# Patient Record
Sex: Male | Born: 1985 | Race: Black or African American | Hispanic: No | Marital: Single | Smoking: Former smoker
Health system: Southern US, Community
[De-identification: ages and names within clinical notes are randomized; demographics above are authoritative.]

## PROBLEM LIST (undated history)

## (undated) DIAGNOSIS — S52501A Unspecified fracture of the lower end of right radius, initial encounter for closed fracture: Secondary | ICD-10-CM

## (undated) HISTORY — PX: WISDOM TOOTH EXTRACTION: SHX21

---

## 2009-11-11 ENCOUNTER — Emergency Department (HOSPITAL_COMMUNITY): Admission: EM | Admit: 2009-11-11 | Discharge: 2009-11-12 | Payer: Self-pay | Admitting: Emergency Medicine

## 2011-01-02 ENCOUNTER — Emergency Department (HOSPITAL_COMMUNITY)
Admission: EM | Admit: 2011-01-02 | Discharge: 2011-01-02 | Disposition: A | Discharge status: Home / Self Care | Payer: Medicaid Other | Attending: Emergency Medicine | Admitting: Emergency Medicine

## 2011-01-02 DIAGNOSIS — S1093XA Contusion of unspecified part of neck, initial encounter: Secondary | ICD-10-CM | POA: Insufficient documentation

## 2011-01-02 DIAGNOSIS — T07XXXA Unspecified multiple injuries, initial encounter: Secondary | ICD-10-CM | POA: Insufficient documentation

## 2011-01-02 DIAGNOSIS — S61509A Unspecified open wound of unspecified wrist, initial encounter: Secondary | ICD-10-CM | POA: Insufficient documentation

## 2011-01-02 DIAGNOSIS — H1130 Conjunctival hemorrhage, unspecified eye: Secondary | ICD-10-CM | POA: Insufficient documentation

## 2011-01-02 DIAGNOSIS — S0003XA Contusion of scalp, initial encounter: Secondary | ICD-10-CM | POA: Insufficient documentation

## 2013-05-23 ENCOUNTER — Emergency Department (HOSPITAL_COMMUNITY)
Admission: EM | Admit: 2013-05-23 | Discharge: 2013-05-23 | Disposition: A | Discharge status: Home / Self Care | Payer: Medicaid Other | Attending: Emergency Medicine | Admitting: Emergency Medicine

## 2013-05-23 ENCOUNTER — Encounter (HOSPITAL_COMMUNITY): Payer: Self-pay | Admitting: Emergency Medicine

## 2013-05-23 DIAGNOSIS — F17200 Nicotine dependence, unspecified, uncomplicated: Secondary | ICD-10-CM | POA: Insufficient documentation

## 2013-05-23 DIAGNOSIS — H00019 Hordeolum externum unspecified eye, unspecified eyelid: Secondary | ICD-10-CM | POA: Insufficient documentation

## 2013-05-23 DIAGNOSIS — Y939 Activity, unspecified: Secondary | ICD-10-CM | POA: Insufficient documentation

## 2013-05-23 DIAGNOSIS — H00024 Hordeolum internum left upper eyelid: Secondary | ICD-10-CM

## 2013-05-23 DIAGNOSIS — Y929 Unspecified place or not applicable: Secondary | ICD-10-CM | POA: Insufficient documentation

## 2013-05-23 MED ORDER — POLYMYXIN B-TRIMETHOPRIM 10000-0.1 UNIT/ML-% OP SOLN: 1.0000 [drp] | OPHTHALMIC | Status: DC

## 2013-05-23 NOTE — ED Provider Notes (Signed)
   History    CSN: [id number] Arrival date & time 05/23/13  1336  First MD Initiated Contact with Patient 05/23/13 1350     Chief Complaint  Patient presents with  . Eye Problem    left eye  . Insect Bite    left eye   (Consider location/radiation/quality/duration/timing/severity/associated sxs/prior Treatment) HPI Comments: Patient is a 27 year old male who presents with a 2 day history of left eyelid swelling. Symptoms started gradually and progressively worsened since the onset. Patient thinks he was bit by a bug. Patient reports associated pain of the upper left eyelid with palpation. The pain is dull and moderate without radiation. Patient denies visual changes or injury to the eye. No alleviating factors.   Patient is a 27 y.o. male presenting with eye problem.  Eye Problem  History reviewed. No pertinent past medical history. History reviewed. No pertinent past surgical history. No family history on file. History  Substance Use Topics  . Smoking status: Current Every Day Smoker  . Smokeless tobacco: Not on file  . Alcohol Use: Yes    Review of Systems  Eyes: Positive for pain.  All other systems reviewed and are negative.    Allergies  Review of patient's allergies indicates no known allergies.  Home Medications  No current outpatient prescriptions on file. BP 128/76  Pulse 74  Temp(Src) 98.2 F (36.8 C) (Oral)  Resp 16  Ht 6\' 1"  (1.854 m)  Wt 180 lb (81.647 kg)  BMI 23.75 kg/m2  SpO2 97% Physical Exam  Nursing note and vitals reviewed. Constitutional: He is oriented to person, place, and time. He appears well-developed and well-nourished. No distress.  HENT:  Head: Normocephalic and atraumatic.  Eyes: Conjunctivae and EOM are normal. Pupils are equal, round, and reactive to light. No scleral icterus.  Left upper eyelid edema with tenderness to palpation over medial aspect.   Neck: Normal range of motion.  Cardiovascular: Normal rate and regular rhythm.   Exam reveals no gallop and no friction rub.   No murmur heard. Pulmonary/Chest: Effort normal and breath sounds normal. He has no wheezes. He has no rales. He exhibits no tenderness.  Abdominal: Soft. He exhibits no distension. There is no tenderness. There is no rebound.  Musculoskeletal: Normal range of motion.  Neurological: He is alert and oriented to person, place, and time. Coordination normal.  Speech is goal-oriented. Moves limbs without ataxia.   Skin: Skin is warm and dry.  Psychiatric: He has a normal mood and affect. His behavior is normal.    ED Course  Procedures (including critical care time) Labs Reviewed - No data to display No results found. 1. Hordeolum internum of left upper eyelid     MDM  2:24 PM Patient likely has hordeolum. I will treat the patient with polytrim drops. Vitals stable and patient afebrile. Patient will be discharged without further evaluation. Patient instructed to return with worsening or concerning symptoms.   Emilia Beck, PA-C 05/23/13 1429

## 2013-05-23 NOTE — ED Notes (Signed)
Pt reports he noticed his left eye swollen. Pt reports possibly bit by an insect. Pt reports sleep over a friends house who window has a hole in it and is known to have wasp around the area.

## 2013-05-23 NOTE — ED Notes (Signed)
Pt discharged.Vital signs stable and GCS 15 

## 2013-05-24 NOTE — ED Provider Notes (Signed)
Medical screening examination/treatment/procedure(s) were performed by non-physician practitioner and as supervising physician I was immediately available for consultation/collaboration.    Vida Roller, MD 05/24/13 [phone]

## 2013-09-18 DIAGNOSIS — S52501A Unspecified fracture of the lower end of right radius, initial encounter for closed fracture: Secondary | ICD-10-CM

## 2013-09-18 HISTORY — DX: Unspecified fracture of the lower end of right radius, initial encounter for closed fracture: S52.501A

## 2013-09-19 ENCOUNTER — Emergency Department (HOSPITAL_COMMUNITY): Payer: Medicaid Other

## 2013-09-19 ENCOUNTER — Encounter (HOSPITAL_COMMUNITY): Payer: Self-pay | Admitting: Emergency Medicine

## 2013-09-19 ENCOUNTER — Emergency Department (HOSPITAL_COMMUNITY)
Admission: EM | Admit: 2013-09-19 | Discharge: 2013-09-19 | Disposition: A | Discharge status: Home / Self Care | Payer: Medicaid Other | Attending: Emergency Medicine | Admitting: Emergency Medicine

## 2013-09-19 DIAGNOSIS — F17200 Nicotine dependence, unspecified, uncomplicated: Secondary | ICD-10-CM | POA: Insufficient documentation

## 2013-09-19 DIAGNOSIS — Y929 Unspecified place or not applicable: Secondary | ICD-10-CM | POA: Insufficient documentation

## 2013-09-19 DIAGNOSIS — Y9389 Activity, other specified: Secondary | ICD-10-CM | POA: Insufficient documentation

## 2013-09-19 DIAGNOSIS — S52599A Other fractures of lower end of unspecified radius, initial encounter for closed fracture: Secondary | ICD-10-CM | POA: Insufficient documentation

## 2013-09-19 DIAGNOSIS — S52501A Unspecified fracture of the lower end of right radius, initial encounter for closed fracture: Secondary | ICD-10-CM

## 2013-09-19 DIAGNOSIS — R296 Repeated falls: Secondary | ICD-10-CM | POA: Insufficient documentation

## 2013-09-19 MED ORDER — OXYCODONE-ACETAMINOPHEN 5-325 MG PO TABS
2.0000 | ORAL_TABLET | Freq: Once | ORAL | Status: AC
  Administered 2013-09-19: 2 via ORAL
  Filled 2013-09-19: qty 2

## 2013-09-19 MED ORDER — NAPROXEN 500 MG PO TABS: 500.0000 mg | ORAL_TABLET | Freq: Two times a day (BID) | ORAL | Status: DC

## 2013-09-19 MED ORDER — OXYCODONE-ACETAMINOPHEN 5-325 MG PO TABS: 1.0000 | ORAL_TABLET | Freq: Four times a day (QID) | ORAL | Status: DC | PRN

## 2013-09-19 NOTE — ED Provider Notes (Signed)
CSN: [id number]     Arrival date & time 09/19/13  1815 History   First MD Initiated Contact with Patient 09/19/13 1823     Chief Complaint  Patient presents with  . Wrist Injury   (Consider location/radiation/quality/duration/timing/severity/associated sxs/prior Treatment) HPI Comments: Patient presents with right wrist injury that he sustained late last night while intoxicated. Patient reports falling. He is unable to remember the details of the event. He is unsure as to what position he fell. Patient states that he did not feel any pain last night but upon waking today he had severe throbbing in his left wrist and forearm. Patient is unable to move his wrist due to pain. He denies numbness or tingling in his hand or fingers. He is able to wiggle his fingers. No treatments prior to arrival. Patient denies other injury from the fall including hitting his head, neck pain, nausea or vomiting, blurry vision. Course is constant.  Patient is a 27 y.o. male presenting with wrist injury. The history is provided by the patient.  Wrist Injury Associated symptoms: no back pain and no neck pain     History reviewed. No pertinent past medical history. History reviewed. No pertinent past surgical history. History reviewed. No pertinent family history. History  Substance Use Topics  . Smoking status: Current Every Day Smoker  . Smokeless tobacco: Not on file  . Alcohol Use: Yes    Review of Systems  Constitutional: Negative for activity change.  Musculoskeletal: Positive for arthralgias. Negative for back pain, gait problem, joint swelling and neck pain.  Skin: Negative for wound.  Neurological: Negative for weakness and numbness.    Allergies  Review of patient's allergies indicates no known allergies.  Home Medications  No current outpatient prescriptions on file. BP 143/71  Pulse 70  Temp(Src) 100 F (37.8 C) (Oral)  Resp 16  Wt 170 lb (77.111 kg)  SpO2 98% Physical Exam  Nursing  note and vitals reviewed. Constitutional: He appears well-developed and well-nourished.  HENT:  Head: Normocephalic and atraumatic.  Eyes: Conjunctivae are normal.  Neck: Normal range of motion. Neck supple.  Cardiovascular: Normal pulses.   Pulses:      Radial pulses are 2+ on the right side, and 2+ on the left side.  Musculoskeletal: He exhibits tenderness. He exhibits no edema.       Right shoulder: Normal.       Right elbow: Normal.      Right wrist: He exhibits decreased range of motion, tenderness, bony tenderness, swelling and deformity (mild).       Cervical back: Normal.       Right forearm: Normal.       Right hand: Normal. Normal sensation noted.  Neurological: He is alert. No sensory deficit.  Motor, sensation, and vascular distal to the injury is fully intact.   Skin: Skin is warm and dry.  Psychiatric: He has a normal mood and affect.    ED Course  Procedures (including critical care time) Labs Review Labs Reviewed - No data to display Imaging Review Dg Wrist Complete Right  09/19/2013   CLINICAL DATA:  Fall.  Wrist injury.  EXAM: RIGHT WRIST - COMPLETE 3+ VIEW  COMPARISON:  None.  FINDINGS: Mildly comminuted distal radial fracture with transverse metaphyseal component and extension into the radiocarpal joint. Transverse ulnar styloid fracture. Radial fracture is more impacted posteriorly. The carpus is not particularly posteriorly displaced.  Old boxer's fracture of the 5th metacarpal noted. There is a small bony density along  the ulnar base of the 5th metacarpal suspicious for age indeterminate fracture.  IMPRESSION: 1. Comminuted distal radial fracture with intra-articular extension, and more impaction dorsally. The carpus does not appear significantly posteriorly displaced currently. 2. Transverse ulnar styloid fracture. 3. Age indeterminate fracture along the ulnar side of the base of the 5th metacarpal. Old boxer's fracture of the 5th metacarpal also noted.    Electronically Signed   By: Herbie Baltimore M.D.   On: 09/19/2013 19:25    EKG Interpretation   None      7:57 PM Patient seen and examined. Informed of x-ray results. Medications ordered. D/w Dr. Anitra Lauth. Will call ortho for reccs.   Vital signs reviewed and are as follows: Filed Vitals:   09/19/13 1850  BP: 143/71  Pulse: 70  Temp: 100 F (37.8 C)  Resp: 16   8:10 PM Spoke with Dr. Mina Marble who has reviewed films. Pt instructed to call office tomorrow for f/u on Tuesday.   Will d/c with pain medications.   Patient counseled on use of narcotic pain medications. Counseled not to combine these medications with others containing tylenol. Urged not to drink alcohol, drive, or perform any other activities that requires focus while taking these medications. The patient verbalizes understanding and agrees with the plan.  Splint and sling by ortho tech.    MDM   1. Distal radius fracture, right, closed, initial encounter    Fracture per above. The extremity is neurovascularly intact. Orthopedic hand followup obtained. Patient provided definitive fracture management with sling and splint. No other injury from fall suspected. Patient appears well.    Renne Crigler, PA-C 09/19/13 2013

## 2013-09-19 NOTE — ED Notes (Signed)
Pt reports that last night he was drinking and fell on his right wrist. States that the swelling has gotten worse and throbbing in nature. Deformity noted to the wrist. Pulse palpated.

## 2013-09-19 NOTE — ED Notes (Signed)
Ortho at bedside.

## 2013-09-19 NOTE — ED Notes (Signed)
PT comfortable with d/c and f/u instructions. Prescriptions x2 

## 2013-09-19 NOTE — Progress Notes (Signed)
Orthopedic Tech Progress Note Patient Details:  Jeremy Burke 1986/05/28 [id number]  Ortho Devices Type of Ortho Device: Ace wrap;Arm sling;Sugartong splint Ortho Device/Splint Location: RUE Ortho Device/Splint Interventions: Ordered;Application   Jennye Moccasin 09/19/2013, 8:26 PM

## 2013-09-20 NOTE — ED Provider Notes (Signed)
Medical screening examination/treatment/procedure(s) were performed by non-physician practitioner and as supervising physician I was immediately available for consultation/collaboration.  EKG Interpretation   None         Ima Hafner, MD 09/20/13 1354 

## 2013-09-21 ENCOUNTER — Encounter (HOSPITAL_BASED_OUTPATIENT_CLINIC_OR_DEPARTMENT_OTHER): Payer: Self-pay | Admitting: *Deleted

## 2013-09-21 ENCOUNTER — Other Ambulatory Visit: Payer: Self-pay | Admitting: Orthopedic Surgery

## 2013-09-22 ENCOUNTER — Encounter (HOSPITAL_BASED_OUTPATIENT_CLINIC_OR_DEPARTMENT_OTHER): Payer: Medicaid Other | Admitting: Anesthesiology

## 2013-09-22 ENCOUNTER — Encounter (HOSPITAL_BASED_OUTPATIENT_CLINIC_OR_DEPARTMENT_OTHER)
Admission: RE | Disposition: A | Discharge status: Home / Self Care | Payer: Self-pay | Source: Ambulatory Visit | Attending: Orthopedic Surgery

## 2013-09-22 ENCOUNTER — Ambulatory Visit (HOSPITAL_BASED_OUTPATIENT_CLINIC_OR_DEPARTMENT_OTHER): Payer: Medicaid Other | Admitting: Anesthesiology

## 2013-09-22 ENCOUNTER — Encounter (HOSPITAL_BASED_OUTPATIENT_CLINIC_OR_DEPARTMENT_OTHER): Payer: Self-pay

## 2013-09-22 ENCOUNTER — Ambulatory Visit (HOSPITAL_BASED_OUTPATIENT_CLINIC_OR_DEPARTMENT_OTHER)
Admission: RE | Admit: 2013-09-22 | Discharge: 2013-09-22 | Disposition: A | Discharge status: Home / Self Care | Payer: Medicaid Other | Source: Ambulatory Visit | Attending: Orthopedic Surgery | Admitting: Orthopedic Surgery

## 2013-09-22 DIAGNOSIS — F17200 Nicotine dependence, unspecified, uncomplicated: Secondary | ICD-10-CM | POA: Insufficient documentation

## 2013-09-22 DIAGNOSIS — S52531A Colles' fracture of right radius, initial encounter for closed fracture: Secondary | ICD-10-CM

## 2013-09-22 DIAGNOSIS — W19XXXA Unspecified fall, initial encounter: Secondary | ICD-10-CM | POA: Insufficient documentation

## 2013-09-22 DIAGNOSIS — S52599A Other fractures of lower end of unspecified radius, initial encounter for closed fracture: Secondary | ICD-10-CM | POA: Insufficient documentation

## 2013-09-22 HISTORY — PX: OPEN REDUCTION INTERNAL FIXATION (ORIF) DISTAL RADIAL FRACTURE: SHX5989

## 2013-09-22 HISTORY — DX: Unspecified fracture of the lower end of right radius, initial encounter for closed fracture: S52.501A

## 2013-09-22 LAB — POCT HEMOGLOBIN-HEMACUE: Hemoglobin: 13.3 g/dL (ref 13.0–17.0)

## 2013-09-22 SURGERY — OPEN REDUCTION INTERNAL FIXATION (ORIF) DISTAL RADIUS FRACTURE
Anesthesia: Regional | Site: Wrist | Laterality: Right | Wound class: Clean

## 2013-09-22 MED ORDER — CEFAZOLIN SODIUM-DEXTROSE 2-3 GM-% IV SOLR
2.0000 g | INTRAVENOUS | Status: AC
  Administered 2013-09-22: 2 g via INTRAVENOUS

## 2013-09-22 MED ORDER — BUPIVACAINE-EPINEPHRINE PF 0.5-1:200000 % IJ SOLN
INTRAMUSCULAR | Status: DC | PRN
  Administered 2013-09-22: 30 mL

## 2013-09-22 MED ORDER — LIDOCAINE HCL (CARDIAC) 20 MG/ML IV SOLN
INTRAVENOUS | Status: DC | PRN
  Administered 2013-09-22: 50 mg via INTRAVENOUS

## 2013-09-22 MED ORDER — OXYCODONE HCL 5 MG/5ML PO SOLN: 5.0000 mg | Freq: Once | ORAL | Status: DC | PRN

## 2013-09-22 MED ORDER — MIDAZOLAM HCL 2 MG/2ML IJ SOLN
INTRAMUSCULAR | Status: AC
  Filled 2013-09-22: qty 2

## 2013-09-22 MED ORDER — SUCCINYLCHOLINE CHLORIDE 20 MG/ML IJ SOLN
INTRAMUSCULAR | Status: DC | PRN
  Administered 2013-09-22: 10 mg via INTRAVENOUS

## 2013-09-22 MED ORDER — LACTATED RINGERS IV SOLN
INTRAVENOUS | Status: DC
  Administered 2013-09-22: 08:00:00 via INTRAVENOUS

## 2013-09-22 MED ORDER — HYDROMORPHONE HCL PF 1 MG/ML IJ SOLN: 0.2500 mg | INTRAMUSCULAR | Status: DC | PRN

## 2013-09-22 MED ORDER — FENTANYL CITRATE 0.05 MG/ML IJ SOLN
INTRAMUSCULAR | Status: AC
  Filled 2013-09-22: qty 2

## 2013-09-22 MED ORDER — MIDAZOLAM HCL 2 MG/2ML IJ SOLN
1.0000 mg | INTRAMUSCULAR | Status: DC | PRN
Start: 2013-09-22 — End: 2013-09-22
  Administered 2013-09-22: 2 mg via INTRAVENOUS

## 2013-09-22 MED ORDER — DEXAMETHASONE SODIUM PHOSPHATE 4 MG/ML IJ SOLN
INTRAMUSCULAR | Status: DC | PRN
  Administered 2013-09-22: 10 mg via INTRAVENOUS

## 2013-09-22 MED ORDER — ONDANSETRON HCL 4 MG/2ML IJ SOLN
INTRAMUSCULAR | Status: DC | PRN
  Administered 2013-09-22: 4 mg via INTRAVENOUS

## 2013-09-22 MED ORDER — FENTANYL CITRATE 0.05 MG/ML IJ SOLN
50.0000 ug | INTRAMUSCULAR | Status: DC | PRN
  Administered 2013-09-22: 100 ug via INTRAVENOUS

## 2013-09-22 MED ORDER — MIDAZOLAM HCL 2 MG/ML PO SYRP: 12.0000 mg | ORAL_SOLUTION | Freq: Once | ORAL | Status: DC | PRN

## 2013-09-22 MED ORDER — OXYCODONE-ACETAMINOPHEN 5-325 MG PO TABS: 1.0000 | ORAL_TABLET | ORAL | Status: DC | PRN

## 2013-09-22 MED ORDER — OXYCODONE HCL 5 MG PO TABS: 5.0000 mg | ORAL_TABLET | Freq: Once | ORAL | Status: DC | PRN

## 2013-09-22 MED ORDER — CEFAZOLIN SODIUM 1-5 GM-% IV SOLN
INTRAVENOUS | Status: AC
  Filled 2013-09-22: qty 100

## 2013-09-22 MED ORDER — CHLORHEXIDINE GLUCONATE 4 % EX LIQD: 60.0000 mL | Freq: Once | CUTANEOUS | Status: DC

## 2013-09-22 MED ORDER — BUPIVACAINE HCL (PF) 0.25 % IJ SOLN
INTRAMUSCULAR | Status: AC
  Filled 2013-09-22: qty 30

## 2013-09-22 MED ORDER — PROPOFOL 10 MG/ML IV EMUL
INTRAVENOUS | Status: AC
  Filled 2013-09-22: qty 50

## 2013-09-22 MED ORDER — PROPOFOL 10 MG/ML IV BOLUS
INTRAVENOUS | Status: DC | PRN
  Administered 2013-09-22: 50 mg via INTRAVENOUS
  Administered 2013-09-22: 200 mg via INTRAVENOUS

## 2013-09-22 MED ORDER — GLYCOPYRROLATE 0.2 MG/ML IJ SOLN
INTRAMUSCULAR | Status: DC | PRN
  Administered 2013-09-22: 0.2 mg via INTRAVENOUS

## 2013-09-22 MED ORDER — BUPIVACAINE HCL (PF) 0.5 % IJ SOLN
INTRAMUSCULAR | Status: DC | PRN
  Administered 2013-09-22: 8 mL via PERINEURAL

## 2013-09-22 SURGICAL SUPPLY — 70 items
APL SKNCLS STERI-STRIP NONHPOA (GAUZE/BANDAGES/DRESSINGS) ×1
BAG DECANTER FOR FLEXI CONT (MISCELLANEOUS) IMPLANT
BANDAGE ELASTIC 3 VELCRO ST LF (GAUZE/BANDAGES/DRESSINGS) IMPLANT
BANDAGE ELASTIC 4 VELCRO ST LF (GAUZE/BANDAGES/DRESSINGS) ×2 IMPLANT
BANDAGE GAUZE ELAST BULKY 4 IN (GAUZE/BANDAGES/DRESSINGS) ×2 IMPLANT
BENZOIN TINCTURE PRP APPL 2/3 (GAUZE/BANDAGES/DRESSINGS) ×2 IMPLANT
BIT DRILL 2 FAST STEP (BIT) ×2 IMPLANT
BIT DRILL 2.5X4 QC (BIT) ×2 IMPLANT
BLADE MINI RND TIP GREEN BEAV (BLADE) IMPLANT
BLADE SURG 15 STRL LF DISP TIS (BLADE) ×2 IMPLANT
BLADE SURG 15 STRL SS (BLADE) ×4
BNDG CMPR 9X4 STRL LF SNTH (GAUZE/BANDAGES/DRESSINGS) ×1
BNDG ESMARK 4X9 LF (GAUZE/BANDAGES/DRESSINGS) ×2 IMPLANT
CANISTER SUCT 1200ML W/VALVE (MISCELLANEOUS) IMPLANT
CORDS BIPOLAR (ELECTRODE) ×2 IMPLANT
COVER TABLE BACK 60X90 (DRAPES) ×2 IMPLANT
CUFF TOURNIQUET SINGLE 18IN (TOURNIQUET CUFF) ×2 IMPLANT
DECANTER SPIKE VIAL GLASS SM (MISCELLANEOUS) IMPLANT
DRAPE EXTREMITY T 121X128X90 (DRAPE) ×2 IMPLANT
DRAPE OEC MINIVIEW 54X84 (DRAPES) ×2 IMPLANT
DRAPE SURG 17X23 STRL (DRAPES) ×2 IMPLANT
DURAPREP 26ML APPLICATOR (WOUND CARE) ×2 IMPLANT
ELECT REM PT RETURN 9FT ADLT (ELECTROSURGICAL)
ELECTRODE REM PT RTRN 9FT ADLT (ELECTROSURGICAL) IMPLANT
GAUZE SPONGE 4X4 16PLY XRAY LF (GAUZE/BANDAGES/DRESSINGS) IMPLANT
GAUZE XEROFORM 1X8 LF (GAUZE/BANDAGES/DRESSINGS) IMPLANT
GLOVE BIO SURGEON STRL SZ7 (GLOVE) ×2 IMPLANT
GLOVE BIO SURGEON STRL SZ8 (GLOVE) ×2 IMPLANT
GLOVE BIOGEL M STRL SZ7.5 (GLOVE) ×2 IMPLANT
GOWN BRE IMP PREV XXLGXLNG (GOWN DISPOSABLE) ×2 IMPLANT
GOWN PREVENTION PLUS XLARGE (GOWN DISPOSABLE) ×2 IMPLANT
NEEDLE HYPO 25X1 1.5 SAFETY (NEEDLE) ×2 IMPLANT
NS IRRIG 1000ML POUR BTL (IV SOLUTION) ×2 IMPLANT
PACK BASIN DAY SURGERY FS (CUSTOM PROCEDURE TRAY) ×2 IMPLANT
PAD CAST 3X4 CTTN HI CHSV (CAST SUPPLIES) ×1 IMPLANT
PAD CAST 4YDX4 CTTN HI CHSV (CAST SUPPLIES) IMPLANT
PADDING CAST ABS 4INX4YD NS (CAST SUPPLIES) ×1
PADDING CAST ABS COTTON 4X4 ST (CAST SUPPLIES) ×1 IMPLANT
PADDING CAST COTTON 3X4 STRL (CAST SUPPLIES) ×2
PADDING CAST COTTON 4X4 STRL (CAST SUPPLIES)
PEG SUBCHONDRAL SMOOTH 2.0X20 (Peg) ×2 IMPLANT
PEG SUBCHONDRAL SMOOTH 2.0X22 (Peg) ×6 IMPLANT
PEG SUBCHONDRAL SMOOTH 2.0X24 (Peg) ×6 IMPLANT
PENCIL BUTTON HOLSTER BLD 10FT (ELECTRODE) IMPLANT
PLATE STAN 24.4X59.5 RT (Plate) ×2 IMPLANT
SCREW BN 12X3.5XNS CORT TI (Screw) ×2 IMPLANT
SCREW CORT 3.5X12 (Screw) ×4 IMPLANT
SCREW CORT 3.5X14 LNG (Screw) ×2 IMPLANT
SHEET MEDIUM DRAPE 40X70 STRL (DRAPES) ×2 IMPLANT
SPLINT PLASTER CAST XFAST 3X15 (CAST SUPPLIES) ×8 IMPLANT
SPLINT PLASTER CAST XFAST 4X15 (CAST SUPPLIES) ×5 IMPLANT
SPLINT PLASTER XTRA FAST SET 4 (CAST SUPPLIES) ×5
SPLINT PLASTER XTRA FASTSET 3X (CAST SUPPLIES) ×8
SPONGE GAUZE 4X4 12PLY (GAUZE/BANDAGES/DRESSINGS) ×2 IMPLANT
STOCKINETTE 4X48 STRL (DRAPES) ×2 IMPLANT
STRIP CLOSURE SKIN 1/2X4 (GAUZE/BANDAGES/DRESSINGS) ×2 IMPLANT
SUCTION FRAZIER TIP 10 FR DISP (SUCTIONS) ×2 IMPLANT
SUT ETHILON 4 0 PS 2 18 (SUTURE) IMPLANT
SUT MERSILENE 4 0 P 3 (SUTURE) IMPLANT
SUT PROLENE 3 0 PS 2 (SUTURE) IMPLANT
SUT SILK 2 0 FS (SUTURE) IMPLANT
SUT VIC AB 0 SH 27 (SUTURE) ×2 IMPLANT
SUT VIC AB 3-0 FS2 27 (SUTURE) IMPLANT
SUT VIC AB 4-0 RB1 18 (SUTURE) ×2 IMPLANT
SUT VICRYL RAPIDE 4/0 PS 2 (SUTURE) ×6 IMPLANT
SYR BULB 3OZ (MISCELLANEOUS) ×2 IMPLANT
SYRINGE 10CC LL (SYRINGE) ×2 IMPLANT
TOWEL OR 17X24 6PK STRL BLUE (TOWEL DISPOSABLE) ×2 IMPLANT
TUBE CONNECTING 20X1/4 (TUBING) ×2 IMPLANT
UNDERPAD 30X30 INCONTINENT (UNDERPADS AND DIAPERS) ×6 IMPLANT

## 2013-09-22 NOTE — Anesthesia Procedure Notes (Addendum)
Anesthesia Regional Block:  Supraclavicular block  Pre-Anesthetic Checklist: ,, timeout performed, Correct Patient, Correct Site, Correct Laterality, Correct Procedure, Correct Position, site marked, Risks and benefits discussed, pre-op evaluation, post-op pain management  Laterality: Right  Prep: Maximum Sterile Barrier Precautions used and chloraprep       Needles:  Injection technique: Single-shot  Needle Type: Echogenic Stimulator Needle     Needle Length: 5cm 5 cm Needle Gauge: 22 and 22 G    Additional Needles:  Procedures: ultrasound guided (picture in chart) Supraclavicular block Narrative:  Start time: 09/22/2013 8:00 AM End time: 09/22/2013 8:10 AM Injection made incrementally with aspirations every 5 mL. Anesthesiologist: Fitzgerald,MD  Additional Notes: 2% Lidocaine skin wheel. Intercostobrachial block with 8cc of 0.5% Bupivicaine plain.  Supraclavicular block Procedure Name: LMA Insertion Date/Time: 09/22/2013 8:39 AM Performed by: Gar Gibbon Pre-anesthesia Checklist: Patient identified, Emergency Drugs available, Suction available and Patient being monitored Patient Re-evaluated:Patient Re-evaluated prior to inductionOxygen Delivery Method: Circle System Utilized Preoxygenation: Pre-oxygenation with 100% oxygen Intubation Type: IV induction Ventilation: Mask ventilation without difficulty LMA: LMA inserted LMA Size: 4.0 Number of attempts: 1 Airway Equipment and Method: bite block Placement Confirmation: positive ETCO2 Tube secured with: Tape Dental Injury: Teeth and Oropharynx as per pre-operative assessment

## 2013-09-22 NOTE — Op Note (Signed)
See note [id number]

## 2013-09-22 NOTE — Transfer of Care (Signed)
Immediate Anesthesia Transfer of Care Note  Patient: Jeremy Burke  Procedure(s) Performed: Procedure(s): RIGHT OPEN REDUCTION INTERNAL FIXATION (ORIF) DISTAL RADIAL FRACTURE (Right)  Patient Location: PACU  Anesthesia Type:GA combined with regional for post-op pain  Level of Consciousness: awake, sedated and patient cooperative  Airway & Oxygen Therapy: Patient Spontanous Breathing and Patient connected to face mask oxygen  Post-op Assessment: Report given to PACU RN and Post -op Vital signs reviewed and stable  Post vital signs: Reviewed and stable  Complications: No apparent anesthesia complications

## 2013-09-22 NOTE — H&P (Signed)
Jeremy Burke is an 27 y.o. male.   Chief Complaint: right wrist pain HPI: as above s/p fall onto right wrist with displaced distal radius fracture  Past Medical History  Diagnosis Date  . Distal radius fracture, right 09/18/2013    Past Surgical History  Procedure Laterality Date  . Wisdom tooth extraction      History reviewed. No pertinent family history. Social History:  reports that he has been smoking Cigarettes.  He has been smoking about 0.00 packs per day for the past 5 years. He has never used smokeless tobacco. He reports that he drinks alcohol. He reports that he does not use illicit drugs.  Allergies: No Known Allergies  Medications Prior to Admission  Medication Sig Dispense Refill  . naproxen (NAPROSYN) 500 MG tablet Take 1 tablet (500 mg total) by mouth 2 (two) times daily.  20 tablet  0  . oxyCODONE-acetaminophen (PERCOCET/ROXICET) 5-325 MG per tablet Take 1-2 tablets by mouth every 6 (six) hours as needed for severe pain.  15 tablet  0    Results for orders placed during the hospital encounter of 09/22/13 (from the past 48 hour(s))  POCT HEMOGLOBIN-HEMACUE     Status: None   Collection Time    09/22/13  7:56 AM      Result Value Range   Hemoglobin 13.3  13.0 - 17.0 g/dL   No results found.  Review of Systems  All other systems reviewed and are negative.    Blood pressure 141/77, pulse 71, temperature 99.2 F (37.3 C), temperature source Oral, resp. rate 20, height 6\' 1"  (1.854 m), weight 74.844 kg (165 lb), SpO2 98.00%. Physical Exam  Constitutional: He is oriented to person, place, and time. He appears well-developed and well-nourished.  HENT:  Head: Normocephalic and atraumatic.  Cardiovascular: Normal rate.   Respiratory: Effort normal.  Musculoskeletal:       Right wrist: He exhibits bony tenderness, swelling and deformity.  Displaced right distal radius fracture  Neurological: He is alert and oriented to person, place, and time.  Skin:  Skin is warm.  Psychiatric: He has a normal mood and affect. His behavior is normal. Judgment and thought content normal.     Assessment/Plan As above  Plan ORIF  Vallorie Niccoli A 09/22/2013, 8:16 AM

## 2013-09-22 NOTE — Progress Notes (Signed)
Assisted Dr. Fitzgerald with right, ultrasound guided, supraclavicular block. Side rails up, monitors on throughout procedure. See vital signs in flow sheet. Tolerated Procedure well. 

## 2013-09-22 NOTE — Anesthesia Preprocedure Evaluation (Signed)
Anesthesia Evaluation  Patient identified by MRN, date of birth, ID band Patient awake    Reviewed: Allergy & Precautions, H&P , NPO status , Patient's Chart, lab work & pertinent test results  Airway Mallampati: I TM Distance: >3 FB Neck ROM: Full    Dental no notable dental hx. (+) Teeth Intact and Dental Advisory Given   Pulmonary Current Smoker,  breath sounds clear to auscultation  Pulmonary exam normal       Cardiovascular negative cardio ROS  Rhythm:Regular Rate:Normal     Neuro/Psych negative neurological ROS  negative psych ROS   GI/Hepatic negative GI ROS, Neg liver ROS,   Endo/Other  negative endocrine ROS  Renal/GU negative Renal ROS  negative genitourinary   Musculoskeletal   Abdominal   Peds  Hematology negative hematology ROS (+)   Anesthesia Other Findings   Reproductive/Obstetrics negative OB ROS                           Anesthesia Physical Anesthesia Plan  ASA: II  Anesthesia Plan: General and Regional   Post-op Pain Management:    Induction: Intravenous  Airway Management Planned: LMA  Additional Equipment:   Intra-op Plan:   Post-operative Plan: Extubation in OR  Informed Consent: I have reviewed the patients History and Physical, chart, labs and discussed the procedure including the risks, benefits and alternatives for the proposed anesthesia with the patient or authorized representative who has indicated his/her understanding and acceptance.   Dental advisory given  Plan Discussed with: CRNA and Surgeon  Anesthesia Plan Comments:         Anesthesia Quick Evaluation

## 2013-09-22 NOTE — Anesthesia Postprocedure Evaluation (Signed)
  Anesthesia Post-op Note  Patient: Jeremy Burke  Procedure(s) Performed: Procedure(s): RIGHT OPEN REDUCTION INTERNAL FIXATION (ORIF) DISTAL RADIAL FRACTURE (Right)  Patient Location: PACU  Anesthesia Type:General and block  Level of Consciousness: awake and alert   Airway and Oxygen Therapy: Patient Spontanous Breathing  Post-op Pain: none  Post-op Assessment: Post-op Vital signs reviewed, Patient's Cardiovascular Status Stable and Respiratory Function Stable  Post-op Vital Signs: Reviewed  Filed Vitals:   09/22/13 1015  BP: 125/77  Pulse: 71  Temp:   Resp: 20    Complications: No apparent anesthesia complications

## 2013-09-23 ENCOUNTER — Encounter (HOSPITAL_BASED_OUTPATIENT_CLINIC_OR_DEPARTMENT_OTHER): Payer: Self-pay | Admitting: Orthopedic Surgery

## 2013-09-23 NOTE — Op Note (Signed)
NAME:  Pryor, Guettler Mickle          ACCOUNT NO.:  [id number]  MEDICAL RECORD NO.:  [id number]  LOCATION:                               FACILITY:  [facility]  PHYSICIAN:  Artist Pais. Keishana Klinger, M.D.DATE OF BIRTH:  07-Mar-1986  DATE OF PROCEDURE:  09/22/2013 DATE OF DISCHARGE:  09/22/2013                              OPERATIVE REPORT   PREOPERATIVE DIAGNOSIS:  Displaced intra-articular fracture distal radius right.  POSTOPERATIVE DIAGNOSIS:  Displaced intra-articular fracture distal radius right.  PROCEDURE:  Open reduction and internal fixation above with DVR plate and screws.  Release of brachioradialis.  SURGEON:  Artist Pais. Mina Marble, MD.  ASSISTANT:  Marveen Reeks Dasnoit, PA.  ANESTHESIA:  Axillary block and general.  COMPLICATIONS:  No complications.  DRAINS:  No drains.  DESCRIPTION OF PROCEDURE:  The patient was taken to the operating suite. After induction of adequate axillary block analgesia and general laryngeal mask airway anesthetic, right upper extremity was prepped and draped in sterile fashion.  An Esmarch was used to exsanguinate the limb.  Tourniquet was inflated to 250 mmHg.  At this point in time, an incision was made over the palpable border of flexor carpi radialis tendon.  Skin was incised longitudinally.  The FCR sheath was identified and split.  The FCR was tracked to the midline to the radial artery on the lateral side.  This level was developed down well to the pronator quadratus.  We subperiosteally stripped the pronator quadratus off the fracture site revealing intra-articular fracture with the radial styloid and lunate facet fragment.  We carefully released the brachioradialis off the distal fragment to aid in reduction.  We then flexed and ulnar deviated the wrist, brought the C-arm in and confirmed a near-anatomic reduction.  Standard DVR plate was then fastened to the volar aspect of the distal radius through the slotted hole.  We used  fluoroscopy intraoperatively to determine adequate positioning.  We then fixed 2 more cortical screws proximally followed by the smooth pegs distally. Intraoperative fluoroscopy, AP, lateral and oblique views showed adequate reduction in AP, lateral, and oblique again with the restoration of joint surface.  No articular step-off and restoration of volar tilt.  The wound was thoroughly irrigated and closed in layers of 0 Vicryl to reapproximate the pronator quadratus and a 2-0 Vicryl subcutaneously and a 4-0 Vicryl Rapide subcuticular stitch on the skin.  Steri-Strips, 4x4s, fluffs, and a volar splint was applied.  The patient tolerated the procedure well, went to the recovery room in stable fashion.     Artist Pais Mina Marble, M.D.     MAW/MEDQ  D:  09/22/2013  T:  09/22/2013  Job:  [id number]

## 2014-03-02 ENCOUNTER — Emergency Department (HOSPITAL_COMMUNITY)
Admission: EM | Admit: 2014-03-02 | Discharge: 2014-03-02 | Disposition: A | Discharge status: Home / Self Care | Payer: Medicaid Other | Attending: Emergency Medicine | Admitting: Emergency Medicine

## 2014-03-02 ENCOUNTER — Encounter (HOSPITAL_COMMUNITY): Payer: Self-pay | Admitting: Emergency Medicine

## 2014-03-02 DIAGNOSIS — L03317 Cellulitis of buttock: Principal | ICD-10-CM

## 2014-03-02 DIAGNOSIS — L0231 Cutaneous abscess of buttock: Secondary | ICD-10-CM

## 2014-03-02 DIAGNOSIS — Z8781 Personal history of (healed) traumatic fracture: Secondary | ICD-10-CM | POA: Insufficient documentation

## 2014-03-02 DIAGNOSIS — Z791 Long term (current) use of non-steroidal anti-inflammatories (NSAID): Secondary | ICD-10-CM | POA: Insufficient documentation

## 2014-03-02 DIAGNOSIS — F17200 Nicotine dependence, unspecified, uncomplicated: Secondary | ICD-10-CM | POA: Insufficient documentation

## 2014-03-02 NOTE — ED Provider Notes (Signed)
CSN: [id number]     Arrival date & time 03/02/14  2048 History  This chart was scribed for non-physician practitioner Dierdre ForthHannah Yovani Cogburn, PA-C working with Ethelda ChickMartha K Linker, MD by Dorothey Basemania Sutton, ED Scribe. This patient was seen in room TR10C/TR10C and the patient's care was started at 10:30 PM.    Chief Complaint  Patient presents with  . Abscess   The history is provided by the patient. No language interpreter was used.   HPI Comments: Jeremy Burke is a 28 y.o. male who presents to the Emergency Department complaining of a painful abscess to the right side of the upper gluteal cleft/buttock with associated minimal purulent drainage onset about 2 days ago. 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 He denies fever, chills, nausea, vomiting. Patient has no other pertinent medical history. He reports that he does not currently have a PCP.   Past Medical History  Diagnosis Date  . Distal radius fracture, right 09/18/2013   Past Surgical History  Procedure Laterality Date  . Wisdom tooth extraction    . Open reduction internal fixation (orif) distal radial fracture Right 09/22/2013    Procedure: RIGHT OPEN REDUCTION INTERNAL FIXATION (ORIF) DISTAL RADIAL FRACTURE;  Surgeon: Marlowe ShoresMatthew A Weingold, MD;  Location: [facility];  Service: Orthopedics;  Laterality: Right;   History reviewed. No pertinent family history. History  Substance Use Topics  . Smoking status: Current Every Day Smoker -- 5 years    Types: Cigarettes  . Smokeless tobacco: Never Used     Comment: 5-6 cig./day  . Alcohol Use: Yes     Comment: weekends    Review of Systems  Constitutional: Negative for fever and chills.  Gastrointestinal: Negative for nausea and vomiting.  Skin: Positive for wound (abscess).  All other systems reviewed and are negative.  Allergies   Review of patient's allergies indicates no known allergies.  Home Medications   Prior to Admission medications   Medication Sig Start Date End Date Taking? Authorizing Provider  naproxen (NAPROSYN) 500 MG tablet Take 1 tablet (500 mg total) by mouth 2 (two) times daily. 09/19/13   Renne CriglerJoshua Geiple, PA-C  oxyCODONE-acetaminophen (PERCOCET/ROXICET) 5-325 MG per tablet Take 1-2 tablets by mouth every 6 (six) hours as needed for severe pain. 09/19/13   Renne CriglerJoshua Geiple, PA-C  oxyCODONE-acetaminophen (ROXICET) 5-325 MG per tablet Take 1 tablet by mouth every 4 (four) hours as needed for severe pain. 09/22/13   Marlowe ShoresMatthew A Weingold, MD   Triage Vitals: BP 143/82  Pulse 93  Temp(Src) 99.5 F (37.5 C)  Resp 18  SpO2 100%  Physical Exam  Nursing note and vitals reviewed. Constitutional: He is oriented to person, place, and time. He appears well-developed and well-nourished. No distress.  HENT:  Head: Normocephalic and atraumatic.  Eyes: Conjunctivae are normal. No scleral icterus.  Neck: Normal range of motion.  Cardiovascular: Normal rate, regular rhythm, normal heart sounds and intact distal pulses.   No murmur heard. Pulmonary/Chest: Effort normal and breath sounds normal.  Lymphadenopathy:    He has no cervical adenopathy.  Neurological: He is alert and oriented to person, place, and time.  Skin: Skin is warm and dry. He is not diaphoretic. There is erythema.  Just to the right of the midline of the upper gluteal cleft there is a 4 cm x 4 cm area of induration,  2 cm x 2 cm area of fluctuance without surrounding cellulitis or streaking. No perianal induration or tenderness.   Psychiatric: He has a normal mood and affect.    ED Course  Procedures (including critical care time)  DIAGNOSTIC STUDIES: Oxygen Saturation is 100% on room air, normal by my interpretation.    COORDINATION OF CARE: 10:32 PM- Discussed that the area will need to be incised and drained. Discussed treatment plan with  patient at bedside and patient verbalized agreement.   10:39 PM- Successfully performed incision and drainage. Advised patient to follow up at urgent care in about 3 days to have the area rechecked. Discussed treatment plan with patient at bedside and patient verbalized agreement.   INCISION AND DRAINAGE PROCEDURE NOTE: Patient identification was confirmed and verbal consent was obtained. This procedure was performed by Dierdre ForthHannah Kamran Coker, PA-C at 10:35 PM. Site: right side of upper gluteal cleft  Sterile procedures observed Needle size: 21 Anesthetic used (type and amt): 2% lidocaine with epinephrine, 3 mL Blade size: 11 Drainage: copious amounts of purulent fluid Complexity: simple Site anesthetized, incision made over site, wound drained and explored loculations, rinsed with copious amounts of normal saline, wound packed with sterile gauze, covered with dry, sterile dressing.  Pt tolerated procedure well without complications.  Instructions for care discussed verbally and pt provided with additional written instructions for homecare and f/u.  Labs Review Labs Reviewed - No data to display  Imaging Review No results found.   EKG Interpretation None      MDM   Final diagnoses:  Abscess, gluteal, right   Diesel E Goodnow presents with gluteal abscess; no evidence of perianal involvement; no concern for fournier's gangreen.  Patient with skin abscess amenable to incision and drainage.  Abscess was not large enough to warrant packing or drain,  wound recheck in 2 days. Encouraged home warm soaks and flushing.  Mild signs of cellulitis is surrounding skin.  Will d/c to home.  No antibiotic therapy is indicated.  It has been determined that no acute conditions requiring further emergency intervention are present at this time. The patient/guardian have been advised of the diagnosis and plan. We have discussed signs and symptoms that warrant return to the ED, such as changes or  worsening in symptoms.   Vital signs are stable at discharge.   BP 143/82  Pulse 93  Temp(Src) 99.5 F (37.5 C)  Resp 18  SpO2 100%  Patient/guardian has voiced understanding and agreed to follow-up with the PCP or specialist.    I personally performed the services described in this documentation, which was scribed in my presence. The recorded information has been reviewed and is accurate.   Dahlia ClientHannah Martin Belling, PA-C 03/02/14 2256

## 2014-03-02 NOTE — ED Provider Notes (Signed)
Medical screening examination/treatment/procedure(s) were performed by non-physician practitioner and as supervising physician I was immediately available for consultation/collaboration.   EKG Interpretation None       Blanca Carreon K Linker, MD 03/02/14 2319 

## 2014-03-02 NOTE — Discharge Instructions (Signed)
1. Medications: usual home medications 2. Treatment: rest, drink plenty of fluids, warm water soaks several times per day 3. Follow Up: Please followup with [facility] for wound check in 2 days.  If you do not have a primary care doctor use the resource guide provided to find one     Abscess An abscess is an infected area that contains a collection of pus and debris.It can occur in almost any part of the body. An abscess is also known as a furuncle or boil. CAUSES  An abscess occurs when tissue gets infected. This can occur from blockage of oil or sweat glands, infection of hair follicles, or a minor injury to the skin. As the body tries to fight the infection, pus collects in the area and creates pressure under the skin. This pressure causes pain. People with weakened immune systems have difficulty fighting infections and get certain abscesses more often.  SYMPTOMS Usually an abscess develops on the skin and becomes a painful mass that is red, warm, and tender. If the abscess forms under the skin, you may feel a moveable soft area under the skin. Some abscesses break open (rupture) on their own, but most will continue to get worse without care. The infection can spread deeper into the body and eventually into the bloodstream, causing you to feel ill.  DIAGNOSIS  Your caregiver will take your medical history and perform a physical exam. A sample of fluid may also be taken from the abscess to determine what is causing your infection. TREATMENT  Your caregiver may prescribe antibiotic medicines to fight the infection. However, taking antibiotics alone usually does not cure an abscess. Your caregiver may need to make a small cut (incision) in the abscess to drain the pus. In some cases, gauze is packed into the abscess to reduce pain and to continue draining the area. HOME CARE INSTRUCTIONS   Only take over-the-counter or prescription medicines for pain, discomfort, or fever as directed  by your caregiver.  If you were prescribed antibiotics, take them as directed. Finish them even if you start to feel better.  If gauze is used, follow your caregiver's directions for changing the gauze.  To avoid spreading the infection:  Keep your draining abscess covered with a bandage.  Wash your hands well.  Do not share personal care items, towels, or whirlpools with others.  Avoid skin contact with others.  Keep your skin and clothes clean around the abscess.  Keep all follow-up appointments as directed by your caregiver. SEEK MEDICAL CARE IF:   You have increased pain, swelling, redness, fluid drainage, or bleeding.  You have muscle aches, chills, or a general ill feeling.  You have a fever. MAKE SURE YOU:   Understand these instructions.  Will watch your condition.  Will get help right away if you are not doing well or get worse. Document Released: 07/31/2005 Document Revised: 04/21/2012 Document Reviewed: 01/03/2012 [facility] Patient Information 2014 [place], [place].     Emergency Department Resource Guide 1) Find a Doctor and Pay Out of Pocket Although you won't have to find out who is covered by your insurance plan, it is a good idea to ask around and get recommendations. You will then need to call the office and see if the doctor you have chosen will accept you as a new patient and what types of options they offer for patients who are self-pay. Some doctors offer discounts or will set up payment plans for their patients who do  not have insurance, but you will need to ask so you aren't surprised when you get to your appointment.  2) Contact Your Local Health Department Not all health departments have doctors that can see patients for sick visits, but many do, so it is worth a call to see if yours does. If you don't know where your local health department is, you can check in your phone book. The CDC also has a tool to help you locate your state's health  department, and many state websites also have listings of all of their local health departments.  3) Find a Walk-in Clinic If your illness is not likely to be very severe or complicated, you may want to try a walk in clinic. These are popping up all over the country in pharmacies, drugstores, and shopping centers. They're usually staffed by nurse practitioners or physician assistants that have been trained to treat common illnesses and complaints. They're usually fairly quick and inexpensive. However, if you have serious medical issues or chronic medical problems, these are probably not your best option.  No Primary Care Doctor: - Call Health Connect at  [phone] - they can help you locate a primary care doctor that  accepts your insurance, provides certain services, etc. - Physician Referral Service- [phone]  Chronic Pain Problems: Organization         Address  Phone   Notes  [facility]  [phone] Patients need to be referred by their primary care doctor.   Medication Assistance: Organization         Address  Phone   Notes  [facility] Medication [facility] [address] [place] [place], [place] [address] [phone] --Must be a resident of [facility] -- Must have NO insurance coverage whatsoever (no Medicaid/ Medicare, etc.) -- The pt. MUST have a primary care doctor that directs their care regularly and follows them in the community   Med[facility]ist  [phone]   [employer]  [phone]    Agencies that provide inexpensive medical care: Organization         Address  Phone   Notes  [facility]  [phone]   [facility]    [phone]   [facility] [address] [place], [place] [address] [phone]   [facility] [address] [place] [place] [phone]   Planned Parenthood    [phone]   [facility]    [phone]    [facility]  [address], [place] Phone:  [phone], Fax:  [phone] Hours of Operation:  9 am - 6 pm, M-F.  Also accepts Medicaid/Medicare and self-pay.  [facility]  [address], [place] Phone: [phone], Fax: [phone]. Hours of Operation:  8:30 am - 5:30 pm, M-F.  Also accepts Medicaid and self-pay.  [facility] [place] [address], [place] Phone: [phone]   [facility] [address] Natasha Bence [place], [place] [phone], Ext. [phone] Mondays & Thursdays: 7-9 AM.  First 15 patients are seen on a first come, first serve basis.    Medicaid-accepting [facility] Providers:  Organization         Address  Phone   Notes  [facility] [address], [place] [phone] Also accepts self-pay patients.  [facility] [phone]  [address], [place]  [phone]) [phone]   [facility] [address], [place] [phone]   [facility] [address] [place] [address], [place] [phone]   Renaye RakersVeita Bland [address], [place]   [phone]) [phone] Only accepts [place] Access [place] patients after they have their name applied to their card.   Self-Pay (no insurance) in [facility]:  Organization         Address  Phone   Notes  Sickle Cell Patients, [facility] [address], [place] [phone]) [phone]   [facility] [address], [place] [phone]   [facility]  [address], [place] [phone]) [phone]   [facility]/Dr. Osei-Bonsu  [address] [place] [address], [place] or [phone], [place] [phone] Phone number for both [place] and [place] locations is the same.  [facility] [address], [place] [phone]    [facility] [place] [address] [place] [address], [place] or [address] [phone]) [phone]   [facility] [address], [place] [phone]) [phone], phone; [phone]) [phone], fax Sees patients 1st and 3rd Saturday of every month.  Must not qualify for public or private insurance (i.e. Medicaid, Medicare, [place] Health Choice, Veterans' Benefits)  Household income should be no more than 200% of the poverty level The clinic cannot treat you if you are pregnant or think you are pregnant  Sexually transmitted diseases are not treated at the clinic.    Dental Care: Organization         Address  Phone  Notes  [facility] [address], [place] [phone] Accepts children up to age 29 who are enrolled in [place] or [place] Health Choice; pregnant women with a Medicaid card; and children who have applied for Medicaid or [place] Health Choice, but were declined, whose parents can pay a reduced fee at time of service.  [facility] [place]  [address], [place] [phone]) [phone] Accepts children up to age 29 who are enrolled in [place] or [place] Health Choice; pregnant women with a Medicaid card; and children who have applied for Medicaid or [place] Health Choice, but were declined, whose parents can pay a reduced fee at time of service.  [facility]  [address], [place] [phone] Patients are seen by appointment only. Walk-ins are not accepted. [facility] will see patients 29 years of age and older. Monday - Tuesday (8am-5pm) Most Wednesdays (8:30-5pm) $30 per visit, cash only  [facility]  [address], [facility] [phone] Patients are seen by appointment only. Walk-ins are not accepted. [facility] will see patients 29 years of age and older. One Wednesday Evening (Monthly: Volunteer Based).   $30 per visit, cash only  [employer] [facility]  [phone] for adults; Children under age 724, call Graduate Pediatric Dentistry at [phone]. Children aged 334-14, please call [phone] to request a pediatric application.  Dental services are provided in all areas of dental care including fillings, crowns and bridges, complete and partial dentures, implants, gum treatment, root canals, and extractions. Preventive care is also provided. Treatment is provided to both adults and children. Patients are  selected via a lottery and there is often a waiting list.   [facility] [address], [place]  [phone] [email or web address]   [facility] [address] [place], [place] [phone], Ext. [phone] Second and Fourth Thursday of each month, opens at 6:30 AM; Clinic ends at 9 AM.  Patients are seen on a first-come first-served basis, and a limited number are seen during each clinic.   [facility]  [address] Ether Griffins [place], [place] [phone]   Eligibility Requirements You must have lived in [place], [place], or [place] counties for at least the last three months.   You cannot be eligible for state or federal sponsored [place], including [employer], [place], or [employer].   You generally cannot be eligible for healthcare insurance through your employer.    How to apply: Eligibility screenings are held every Tuesday and Wednesday afternoon from 1:00 pm until 4:00 pm. You do not need an appointment for the interview!  [facility] [address] [place], [place] [phone]   [facility] Health Department  [phone]   [facility] Health Department  [phone]   [facility] Health Department  [phone]    Behavioral Health Resources in the Community: Intensive Outpatient Programs Organization         Address  Phone  Notes  [facility] Services [address], [place], [place] [phone]   [facility] [place] [facility] Outpatient [address] [place], [place] [phone]   [facility]: [facility] [address] [place], [place]  [phone]   [facility] [address]  [place], [place] [phone] or [phone]   Substance Abuse Resources Organization         Address  Phone  Notes  [facility] Drug Services  [phone]   [facility]  [phone]   [facility] [place]  [phone]   Floydene Flock  [phone]   Residential & Outpatient Substance Abuse Program  [phone]   Psychological Services Organization         Address  Phone  Notes  [facility]  [phone]   [facility]  [phone]   [facility] Mental Health [address], [place] [phone] or [phone]    Mobile Crisis Teams Organization         Address  Phone  Notes  [facility], Mobile Crisis Care Unit  [phone]   [facility]  [address] [place], [place] [phone]   Doristine Locks [address] [place] [phone]    Self-Help/Support Groups Organization         Address  Phone             Notes  [facility] of [place] - variety of support groups  336- I7437963 Call for more information  Narcotics Anonymous (NA), Caring Services [address], [employer] [place]  2 meetings at this location   Statistician         Address  Phone  Notes  [facility] [address],    [place]  [phone]   [facility]  [address] [place] [address], [place], [place] [phone]   [facility] [address] [place], [place] [phone] Admissions: 8am-3pm M-F  [facility] [address],    [place], [place] [phone]   [facility] [address], [place], [place] [phone]   [facility] [address]  [place], [place] [phone]   [facility] - Intensive Outpatient [address], [place], [place] [phone]   [facility] ([facility] [address]  [place], [place] [phone] or [phone]   [facility] ([facility]) [address] [place], [place] [phone] Accepts Medicaid  [facility] [address]  [place] [phone] Substance Abuse/Addiction Treatment   [facility] Behavioral Health Resources Organization         Address  Phone  Notes  [facility]  [phone]) [phone]   Angie FavaJulie Brannon, PhD [address], [address] [place], [place]   [phone](336) [phone] or [phone]   [facility]   [address] [place], [place] [phone](336) [phone]   [facility] [address], [place], [place] [phone](336) [phone] Insurance/Medicaid/sponsorship through [facility]  Faith and Families [address]                                    [place], [place] [phone](336) [phone] Therapy/tele-psych/case  [facility] [address]   [place], [place] [phone]) [phone]    Dr. Lolly MustacheArfeen  [phone]) [phone]   [facility] [place]  [employer] [facility] 1) [address], [place] 2) [address], [place] 3)  [address], [place] [phone]) [phone]) [phone]  [phone](336) [phone]   [facility] Child Abuse Hotline [phone]) [phone] or [phone](336) [phone] (After Hours)

## 2014-03-02 NOTE — ED Notes (Signed)
Pt reports abcess on right lower back/ upper buttock area; draining

## 2014-03-04 ENCOUNTER — Encounter (HOSPITAL_COMMUNITY): Payer: Self-pay | Admitting: Emergency Medicine

## 2014-03-04 ENCOUNTER — Emergency Department (INDEPENDENT_AMBULATORY_CARE_PROVIDER_SITE_OTHER)
Admission: EM | Admit: 2014-03-04 | Discharge: 2014-03-04 | Disposition: A | Discharge status: Home / Self Care | Payer: Self-pay | Source: Home / Self Care | Attending: Family Medicine | Admitting: Family Medicine

## 2014-03-04 DIAGNOSIS — L0291 Cutaneous abscess, unspecified: Secondary | ICD-10-CM

## 2014-03-04 DIAGNOSIS — L0390 Cellulitis, unspecified: Secondary | ICD-10-CM

## 2014-03-04 NOTE — ED Provider Notes (Signed)
Jeremy Burke is a 28 y.o. male who presents to Urgent Care today for abscess. Patient was seen 2 days prior for abscess of the right buttocks. He was treated with incision and drainage. He's here for followup. He feels much better. No fevers or chills nausea vomiting or diarrhea. Pain is mostly resolved.   Past Medical History  Diagnosis Date  . Distal radius fracture, right 09/18/2013   History  Substance Use Topics  . Smoking status: Current Every Day Smoker -- 5 years    Types: Cigarettes  . Smokeless tobacco: Never Used     Comment: 5-6 cig./day  . Alcohol Use: Yes     Comment: weekends   ROS as above Medications: No current facility-administered medications for this encounter.   No current outpatient prescriptions on file.    Exam:  BP 136/69  Pulse 61  Temp(Src) 98.5 F (36.9 C) (Oral)  Resp 16  SpO2 99% Gen: Well NAD Skin: Well-appearing incision for drainage right buttocks. Nontender no significant erythema. Draining a small amount of pus.  No results found for this or any previous visit (from the past 24 hour(s)). No results found.  Assessment and Plan: 28 y.o. male with well-appearing abscess. Routine management. Followup as needed.  Discussed warning signs or symptoms. Please see discharge instructions. Patient expresses understanding.    Rodolph BongEvan S Corey, MD 03/04/14 [phone]

## 2014-03-04 NOTE — Discharge Instructions (Signed)
Thank you for coming in today. Return as needed.   Abscess Care After An abscess (also called a boil or furuncle) is an infected area that contains a collection of pus. Signs and symptoms of an abscess include pain, tenderness, redness, or hardness, or you may feel a moveable soft area under your skin. An abscess can occur anywhere in the body. The infection may spread to surrounding tissues causing cellulitis. A cut (incision) by the surgeon was made over your abscess and the pus was drained out. Gauze may have been packed into the space to provide a drain that will allow the cavity to heal from the inside outwards. The boil may be painful for 5 to 7 days. Most people with a boil do not have high fevers. Your abscess, if seen early, may not have localized, and may not have been lanced. If not, another appointment may be required for this if it does not get better on its own or with medications. HOME CARE INSTRUCTIONS   Only take over-the-counter or prescription medicines for pain, discomfort, or fever as directed by your caregiver.  When you bathe, soak and then remove gauze or iodoform packs at least daily or as directed by your caregiver. You may then wash the wound gently with mild soapy water. Repack with gauze or do as your caregiver directs. SEEK IMMEDIATE MEDICAL CARE IF:   You develop increased pain, swelling, redness, drainage, or bleeding in the wound site.  You develop signs of generalized infection including muscle aches, chills, fever, or a general ill feeling.  An oral temperature above 102 F (38.9 C) develops, not controlled by medication. See your caregiver for a recheck if you develop any of the symptoms described above. If medications (antibiotics) were prescribed, take them as directed. Document Released: 05/09/2005 Document Revised: 01/13/2012 Document Reviewed: 01/04/2008 [facility] Patient Information 2014 [place], [place].

## 2014-03-04 NOTE — ED Notes (Signed)
Pt is here for a f/u on abscess to right glut Voices no new concerns Alert w/no signs of acute distress.

## 2015-03-13 ENCOUNTER — Emergency Department (HOSPITAL_COMMUNITY): Payer: Self-pay

## 2015-03-13 ENCOUNTER — Emergency Department (HOSPITAL_COMMUNITY)
Admission: EM | Admit: 2015-03-13 | Discharge: 2015-03-13 | Disposition: A | Discharge status: Home / Self Care | Payer: Self-pay | Attending: Emergency Medicine | Admitting: Emergency Medicine

## 2015-03-13 ENCOUNTER — Encounter (HOSPITAL_COMMUNITY): Payer: Self-pay | Admitting: Emergency Medicine

## 2015-03-13 DIAGNOSIS — X58XXXA Exposure to other specified factors, initial encounter: Secondary | ICD-10-CM | POA: Insufficient documentation

## 2015-03-13 DIAGNOSIS — Y9389 Activity, other specified: Secondary | ICD-10-CM | POA: Insufficient documentation

## 2015-03-13 DIAGNOSIS — Z23 Encounter for immunization: Secondary | ICD-10-CM | POA: Insufficient documentation

## 2015-03-13 DIAGNOSIS — S51811A Laceration without foreign body of right forearm, initial encounter: Secondary | ICD-10-CM | POA: Insufficient documentation

## 2015-03-13 DIAGNOSIS — Z720 Tobacco use: Secondary | ICD-10-CM | POA: Insufficient documentation

## 2015-03-13 DIAGNOSIS — Y9289 Other specified places as the place of occurrence of the external cause: Secondary | ICD-10-CM | POA: Insufficient documentation

## 2015-03-13 DIAGNOSIS — Y998 Other external cause status: Secondary | ICD-10-CM | POA: Insufficient documentation

## 2015-03-13 DIAGNOSIS — S41111A Laceration without foreign body of right upper arm, initial encounter: Secondary | ICD-10-CM

## 2015-03-13 MED ORDER — BACITRACIN 500 UNIT/GM EX OINT
3.0000 "application " | TOPICAL_OINTMENT | Freq: Two times a day (BID) | CUTANEOUS | Status: DC
  Filled 2015-03-13 (×2): qty 2.7
  Filled 2015-03-13: qty 1.8
  Filled 2015-03-13: qty 2.7

## 2015-03-13 MED ORDER — LIDOCAINE-EPINEPHRINE 2 %-1:100000 IJ SOLN
INTRAMUSCULAR | Status: AC
  Administered 2015-03-13: 1 mL
  Filled 2015-03-13: qty 1

## 2015-03-13 MED ORDER — TETANUS-DIPHTH-ACELL PERTUSSIS 5-2.5-18.5 LF-MCG/0.5 IM SUSP
0.5000 mL | Freq: Once | INTRAMUSCULAR | Status: AC
  Administered 2015-03-13: 0.5 mL via INTRAMUSCULAR
  Filled 2015-03-13: qty 0.5

## 2015-03-13 MED ORDER — CEPHALEXIN 500 MG PO CAPS
500.0000 mg | ORAL_CAPSULE | Freq: Once | ORAL | Status: AC
  Administered 2015-03-13: 500 mg via ORAL
  Filled 2015-03-13: qty 1

## 2015-03-13 MED ORDER — CEPHALEXIN 500 MG PO CAPS: 500.0000 mg | ORAL_CAPSULE | Freq: Four times a day (QID) | ORAL | Status: DC

## 2015-03-13 NOTE — ED Notes (Signed)
BLOOD cULTURE IS COMPLETE, SENDING TO LAB
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
## 2015-03-13 NOTE — ED Notes (Signed)
Pt noted with laceration to rt elbow with controlled bleeding. Pt has pressure dsg intact and arm elevated. (+)PMS, CRT brisk, no deformity but has LROM noted.

## 2015-03-13 NOTE — ED Notes (Signed)
Joni ReiningNicole, PA at bedside to suture lac to rt elbow.

## 2015-03-13 NOTE — ED Notes (Signed)
Family at bedside. 

## 2015-03-13 NOTE — Discharge Instructions (Signed)
Take your antibiotics as directed and to completion. You should never have any leftover antibiotics! Push fluids and stay well hydrated.   Keep wound dry and do not remove dressing for 24 hours if possible. After that, wash gently morning and night (every 12 hours) with soap and water. Use a topical antibiotic ointment and cover with a bandaid or gauze.    Do NOT use rubbing alcohol or hydrogen peroxide, do not soak the area   Present to your primary care doctor or the urgent care of your choice, or the ED for stapleremoval in 10 days.   Every attempt was made to remove foreign body (contaminants) from the wound.  However, there is always a Quanta that some may remain in the wound. This can  increase your risk of infection.   If you see signs of infection (warmth, redness, tenderness, pus, sharp increase in pain, fever, red streaking in the skin) immediately return to the emergency department.   After the wound heals fully, apply sunscreen for 6-12 months to minimize scarring.   Do not hesitate to return to the emergency room for any new, worsening or concerning symptoms.  Please obtain primary care using resource guide below. Let them know that you were seen in the emergency room and that they will need to obtain records for further outpatient management.    Emergency Department Resource Guide 1) Find a Doctor and Pay Out of Pocket Although you won't have to find out who is covered by your insurance plan, it is a good idea to ask around and get recommendations. You will then need to call the office and see if the doctor you have chosen will accept you as a new patient and what types of options they offer for patients who are self-pay. Some doctors offer discounts or will set up payment plans for their patients who do not have insurance, but you will need to ask so you aren't surprised when you get to your appointment.  2) Contact Your Local Health Department Not all health departments  have doctors that can see patients for sick visits, but many do, so it is worth a call to see if yours does. If you don't know where your local health department is, you can check in your phone book. The CDC also has a tool to help you locate your state's health department, and many state websites also have listings of all of their local health departments.  3) Find a Walk-in Clinic If your illness is not likely to be very severe or complicated, you may want to try a walk in clinic. These are popping up all over the country in pharmacies, drugstores, and shopping centers. They're usually staffed by nurse practitioners or physician assistants that have been trained to treat common illnesses and complaints. They're usually fairly quick and inexpensive. However, if you have serious medical issues or chronic medical problems, these are probably not your best option.  No Primary Care Doctor: - Call Health Connect at  [phone] - they can help you locate a primary care doctor that  accepts your insurance, provides certain services, etc. - Physician Referral Service- [phone]  Chronic Pain Problems: Organization         Address  Phone   Notes  [facility]  [phone] Patients need to be referred by their primary care doctor.   Medication Assistance: Organization         Address  Phone   Notes  [facility]  Medication Assistance Program [address] [place], [place] [address] [phone] --Must be a resident of [facility] [place] -- Must have NO insurance coverage whatsoever (no Medicaid/ Medicare, etc.) -- The pt. MUST have a primary care doctor that directs their care regularly and follows them in the community   MedAssist  [phone]   [employer]  [phone]    Agencies that provide inexpensive medical care: Organization         Address  Phone   Notes  [facility]  [phone]   [facility]    [phone]    [facility] [address] [place], [place] [address] [phone]   [facility] [address], [place] [phone]   Planned Parenthood    [phone]) [phone]   [facility]    [phone]   [facility]  [address], [place] Phone:  [phone], Fax:  [phone]) [phone] Hours of Operation:  9 am - 6 pm, M-F.  Also accepts Medicaid/Medicare and self-pay.  [facility]  [address], [place] Phone: [phone](336) [phone], Fax: [phone]. Hours of Operation:  8:30 am - 5:30 pm, M-F.  Also accepts Medicaid and self-pay.  [facility] [place] [address], [place] Phone: [phone]) [phone]   [facility] [address], [place], [place] [phone], Ext. [phone] Mondays & Thursdays: 7-9 AM.  First 15 patients are seen on a first come, first serve basis.    Medicaid-accepting [facility] Providers:  Organization         Address  Phone   Notes  [facility] [address], [place] [phone]) [phone] Also accepts self-pay patients.  [facility] [address], [place]  [phone]   [facility] [address], [place] [phone]) [phone]   [facility] [address] [place] [address], [place] [phone]   Renaye RakersVeita Bland [address], [place]   [phone] Only accepts [place] Access [place] patients after they have their name applied to their card.   Self-Pay (no insurance) in [facility]:  Organization         Address  Phone   Notes  Sickle Cell Patients, [facility] [address], [place] [phone]) [phone]   [facility] [address], [place] [phone]   [facility]  [address], [place] [phone]   [facility]/Dr. Osei-Bonsu  [address] [place] [address], [place] or [phone], [place] [phone]) [phone] Phone number for both [place] and [place] locations is the same.  [facility] [address], [place] [phone]   [facility] [address] [place] [address], [place] or [address] [phone]) [phone]) [phone]   [facility] [address], [place] [phone]) [phone], phone; [phone], fax Sees patients 1st and 3rd Saturday of every month.  Must not qualify for public or private insurance (i.e. Medicaid, Medicare, [place] Health Choice, Veterans' Benefits)  Household income should be no more than 200% of the poverty level The clinic cannot treat you if you are pregnant or think you are pregnant  Sexually transmitted diseases are not treated  at the clinic.    Dental Care: Organization         Address  Phone  Notes  [facility] Department of [facility] [address] [place], [place] [phone] Accepts children up to age 83 who are enrolled in [place] or [place] Health Choice; pregnant women with a Medicaid card; and children who have applied for Medicaid or [place] Health Choice, but were declined, whose parents can pay a reduced fee at time of service.  [facility] Department of [facility]  [address], [place] [phone] Accepts children up to age 71 who are enrolled in [place] or [place] Health Choice; pregnant women with a Medicaid card; and children who have applied for Medicaid or [place] Health Choice, but were declined, whose parents can pay a reduced fee at time of service.  [facility]  [address] [place], [place] [phone] Patients are seen by appointment only. Walk-ins are not accepted. [facility] will see patients 70 years of age and older. Monday - Tuesday (8am-5pm) Most Wednesdays (8:30-5pm) $30 per visit, cash only  [facility]  Adult Dental Access PROGRAM  [address], [facility] [phone] Patients are seen by appointment only. Walk-ins are not accepted. [facility] will see patients 5 years of age and older. One Wednesday Evening (Monthly: Volunteer Based).  $30 per visit, cash only  [employer]  [phone] for adults; Children under age 69, call Graduate Pediatric Dentistry at [phone]. Children aged 33-14, please call [phone] to request a pediatric application.  Dental services are provided in all areas of dental care including fillings, crowns and bridges, complete and partial dentures, implants, gum treatment, root canals, and extractions. Preventive care is also provided. Treatment is provided to both adults and children. Patients are selected via a lottery and there is often a waiting list.   [facility] [address], [place]  [phone] [email or web address]   [facility] [address] [place], [place] [phone], Ext. [phone] Second and Fourth Thursday of each month, opens at 6:30 AM; Clinic ends at 9 AM.  Patients are seen on a first-come first-served basis, and a limited number are seen during each clinic.   [facility]  [address] [place], [place] [phone]   Eligibility Requirements You must have lived in [place], [place], or [place] counties for at least the last three months.   You cannot be eligible for state or federal sponsored [place], including [employer], [place], or [employer].   You generally cannot be eligible for healthcare insurance through your employer.    How to apply: Eligibility screenings are held every Tuesday and Wednesday afternoon from 1:00 pm until 4:00 pm. You do not need an appointment for the interview!  [facility] [address] [place], [place] [phone]   [facility] Health Department  [phone]   [facility] Health Department  [phone]   [facility] Health Department  [phone]    Behavioral Health Resources in the Community: Intensive Outpatient Programs Organization         Address  Phone  Notes  [facility] [address] [place], [place] [phone]   [facility] [address], [place], [place] [phone]   [facility] [address] [place], [place]  [phone]   [facility]  Mental Health [address],  [place], [place] [phone] or [phone]   Substance Abuse Resources Organization         Address  Phone  Notes  [facility] Drug Services  [phone]   [facility]  [phone]   [facility]  [phone]   Floydene Flock  [phone]   Residential & Outpatient Substance Abuse Program  [phone]   Psychological Services Organization         Address  Phone  Notes  [facility] Behavioral Health  [phone]   [facility]  [phone]   [facility] Mental Health [address], [place] [phone] or [phone]    Mobile Crisis Teams Organization         Address  Phone  Notes  [facility], Mobile Crisis Care Unit  [phone]   [facility]  [address] [place], [place] [phone]   Doristine Locks [address] [place] [phone]    Self-Help/Support Groups Organization         Address  Phone             Notes  [facility] [place] - variety of support groups  [phone]- [phone] Call for more information  Narcotics Anonymous (NA), [facility] [address], [employer] [place]  2 meetings at this location   Statistician         Address  Phone  Notes  [facility] [address],    [place]  [phone]   [facility]  [address], [place] [address], [place], [place] [phone]   [facility] Treatment  Facility [address] [place], [place] [phone] Admissions: 8am-3pm M-F  [facility] [address]    [place], [place] [phone]   [facility] [address] [place], [place], [place] [phone]   [facility] [address]  [place], [place] [phone]   [facility] - Intensive Outpatient [address], [place], [place] [phone]   [facility] [address] [place]  [place], [place] [phone] or [phone]   [facility] ([facility]) [address], [place], [place] [phone] Accepts Medicaid  [facility] [place] [address],  [place] [phone] Substance Abuse/Addiction Treatment   [facility]         Address  Phone  Notes  [facility]  [phone]   Angie Fava, PhD [address] [place], [place]   [phone] or [phone]   [facility] Behavioral   [address] [place], [place] [phone]   [facility] [address], [place], [place] [phone] Insurance/Medicaid/sponsorship through [facility] and Families [address]                                    [place], [place] [phone] Therapy/tele-psych/case  [facility] [address], [place] [phone]    Dr. Lolly Mustache  [phone]   [facility]  [employer] [facility] Dept. 1) [address]  2) [address], [place] 3)  [address], [place] [phone]  [phone]   [facility] Child Abuse Hotline [phone] or [phone] (After Hours)
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
## 2015-03-13 NOTE — ED Notes (Signed)
Patient transported to X-ray and returned to room without distress noted. 

## 2015-03-13 NOTE — ED Notes (Signed)
PA suture and bandage lac to rt elbow. Pt able move fingers, CRT brisk, (+)PMS. No discoloration noted.

## 2015-03-13 NOTE — ED Notes (Signed)
I will get patients blood once PA is finish stitching patient up.

## 2015-03-13 NOTE — ED Notes (Signed)
Awake. Verbally responsive. A/O x4. Resp even and unlabored. No audible adventitious breath sounds noted. ABC's intact.  

## 2015-03-22 ENCOUNTER — Encounter (HOSPITAL_COMMUNITY): Payer: Self-pay

## 2015-03-22 ENCOUNTER — Emergency Department (HOSPITAL_COMMUNITY)
Admission: EM | Admit: 2015-03-22 | Discharge: 2015-03-22 | Disposition: A | Discharge status: Home / Self Care | Payer: Medicaid Other | Attending: Emergency Medicine | Admitting: Emergency Medicine

## 2015-03-22 DIAGNOSIS — Z8781 Personal history of (healed) traumatic fracture: Secondary | ICD-10-CM | POA: Insufficient documentation

## 2015-03-22 DIAGNOSIS — Z4802 Encounter for removal of sutures: Secondary | ICD-10-CM

## 2015-03-22 DIAGNOSIS — Z792 Long term (current) use of antibiotics: Secondary | ICD-10-CM | POA: Insufficient documentation

## 2015-03-22 DIAGNOSIS — Z720 Tobacco use: Secondary | ICD-10-CM | POA: Insufficient documentation

## 2015-03-22 NOTE — ED Notes (Signed)
Pt escorted to discharge window. Verbalized understanding discharge instructions. In no acute distress.   

## 2015-03-22 NOTE — ED Notes (Signed)
Pt presents for R forearm staple removal.  Denies pain.  Pt reports having staples placed 1.5 weeks ago.  No redness, swelling, or discharge noted to area.

## 2015-03-22 NOTE — ED Provider Notes (Signed)
CSN: [id number]     Arrival date & time 03/22/15  1028 History   First MD Initiated Contact with Patient 03/22/15 1032     Chief Complaint  Patient presents with  . Suture / Staple Removal     (Consider location/radiation/quality/duration/timing/severity/associated sxs/prior Treatment) HPI  Pt is a 29yo male presenting to ED for removal of 8 staples that were placed on 03/13/15 after sustaining a large laceration from hitting it on a storm door at his house. Pt states he completed his antibiotics. Denies pain or drainage from area. States he has been using antibiotic ointment on area as advised by ED provider.  Denies fever, chills, n/v/d. No other concerns.   Past Medical History  Diagnosis Date  . Distal radius fracture, right 09/18/2013   Past Surgical History  Procedure Laterality Date  . Wisdom tooth extraction    . Open reduction internal fixation (orif) distal radial fracture Right 09/22/2013    Procedure: RIGHT OPEN REDUCTION INTERNAL FIXATION (ORIF) DISTAL RADIAL FRACTURE;  Surgeon: Marlowe ShoresMatthew A Weingold, MD;  Location: [facility];  Service: Orthopedics;  Laterality: Right;   History reviewed. No pertinent family history. History  Substance Use Topics  . Smoking status: Current Every Day Smoker -- 5 years    Types: Cigarettes  . Smokeless tobacco: Never Used     Comment: 5-6 cig./day  . Alcohol Use: Yes     Comment: weekends    Review of Systems  Musculoskeletal: Negative for myalgias and arthralgias.  Skin: Positive for wound ( healing). Negative for color change and rash.      Allergies  Review of patient's allergies indicates no known allergies.  Home Medications   Prior to Admission medications   Medication Sig Start Date End Date Taking? Authorizing Provider  cephALEXin (KEFLEX) 500 MG capsule Take 1 capsule (500 mg total) by mouth 4 (four) times daily. Patient not taking: Reported on 03/22/2015 03/13/15   Joni ReiningNicole Pisciotta, PA-C   BP 115/69 mmHg   Pulse 82  Temp(Src) 99.1 F (37.3 C) (Oral)  Resp 16  SpO2 96% Physical Exam  Constitutional: He is oriented to person, place, and time. He appears well-developed and well-nourished.  HENT:  Head: Normocephalic and atraumatic.  Eyes: EOM are normal.  Neck: Normal range of motion.  Cardiovascular: Normal rate.   Pulmonary/Chest: Effort normal.  Musculoskeletal: Normal range of motion.  Right elbow: FROM no tenderness. (see skin exam)  Neurological: He is alert and oriented to person, place, and time.  Skin: Skin is warm and dry. No erythema.  Right proximal forearm: well healing, clean dry 10cm laceration with 8 staples in place. No bleeding or discharge. No erythema or warmth.  Psychiatric: He has a normal mood and affect. His behavior is normal.  Nursing note and vitals reviewed.   ED Course  Procedures   SUTURE REMOVAL Performed by: Junius Finner'MALLEY, Joel Cowin A.  Consent: Verbal consent obtained. Consent given by: patient Required items: required blood products, implants, devices, and special equipment available Time out: Immediately prior to procedure a "time out" was called to verify the correct patient, procedure, equipment, support staff and site/side marked as required.  Location: Right elbow/proximal forearm  Wound Appearance: clean  Sutures/Staples Removed: 8  Patient tolerance: Patient tolerated the procedure well with no immediate complications.    Labs Review Labs Reviewed - No data to display  Imaging Review No results found.   EKG Interpretation None      MDM   Final diagnoses:  Encounter for  staple removal    Pt here for staple removal after sustaining a large laceration to his Right proximal forearm on 03/13/15. Wound appears to be healing well. It is clean and dry. No evidence of underlying infection. Advised pt to keep clean and covered while working as to help keep protected until completely healed. Home care instructions provided. Pt verbalized  understanding and agreement with tx plan.    Junius Finnerrin O'Malley, PA-C 03/22/15 1139  Mirian MoMatthew Gentry, MD 03/24/15 [phone]

## 2016-02-02 ENCOUNTER — Encounter (HOSPITAL_COMMUNITY): Payer: Self-pay | Admitting: Emergency Medicine

## 2016-02-02 ENCOUNTER — Emergency Department (HOSPITAL_COMMUNITY)
Admission: EM | Admit: 2016-02-02 | Discharge: 2016-02-02 | Disposition: A | Discharge status: Home / Self Care | Payer: BLUE CROSS/BLUE SHIELD | Attending: Emergency Medicine | Admitting: Emergency Medicine

## 2016-02-02 DIAGNOSIS — Z8781 Personal history of (healed) traumatic fracture: Secondary | ICD-10-CM | POA: Diagnosis not present

## 2016-02-02 DIAGNOSIS — F17210 Nicotine dependence, cigarettes, uncomplicated: Secondary | ICD-10-CM | POA: Diagnosis not present

## 2016-02-02 DIAGNOSIS — R220 Localized swelling, mass and lump, head: Secondary | ICD-10-CM | POA: Diagnosis present

## 2016-02-02 DIAGNOSIS — L0201 Cutaneous abscess of face: Secondary | ICD-10-CM | POA: Diagnosis not present

## 2016-02-02 DIAGNOSIS — L0291 Cutaneous abscess, unspecified: Secondary | ICD-10-CM

## 2016-02-02 MED ORDER — SULFAMETHOXAZOLE-TRIMETHOPRIM 800-160 MG PO TABS: 1.0000 | ORAL_TABLET | Freq: Two times a day (BID) | ORAL | Status: AC
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
## 2016-02-02 NOTE — Discharge Instructions (Signed)
Take the prescribed medication as directed.  Recommend warm compresses at home 2-3 times a day, this should help with drainage/healing. Follow-up with your doctor. Return to the ED for new or worsening symptoms.

## 2016-02-02 NOTE — ED Notes (Signed)
Pt reports tenderness and swelling on r/side of face. Denies "pain". Drainage from raised area x 1 day, drainage thick and blood tinged

## 2016-02-23 ENCOUNTER — Ambulatory Visit (INDEPENDENT_AMBULATORY_CARE_PROVIDER_SITE_OTHER): Payer: BLUE CROSS/BLUE SHIELD | Admitting: Family Medicine

## 2016-02-23 VITALS — BP 124/66 | HR 77 | Temp 98.9°F | Resp 16 | Ht 73.0 in | Wt 194.0 lb

## 2016-02-23 DIAGNOSIS — R300 Dysuria: Secondary | ICD-10-CM | POA: Diagnosis not present

## 2016-02-23 LAB — POCT URINALYSIS DIP (MANUAL ENTRY)
BILIRUBIN UA: NEGATIVE
Blood, UA: NEGATIVE
GLUCOSE UA: NEGATIVE
Leukocytes, UA: NEGATIVE
Nitrite, UA: NEGATIVE
SPEC GRAV UA: 1.02
Urobilinogen, UA: 4
pH, UA: 6

## 2016-02-23 LAB — POC MICROSCOPIC URINALYSIS (UMFC): MUCUS RE: ABSENT

## 2016-02-23 MED ORDER — AZITHROMYCIN 250 MG PO TABS
ORAL_TABLET | ORAL | Status: DC
Start: 2016-02-23 — End: 2019-01-01

## 2016-02-23 NOTE — Patient Instructions (Addendum)
The urine does show some infection.  I am running tests to determine what kind of infection.  In the meantime, take the antibiotic prescribed:  4 tablets of azithromycin at one time   IF you received an x-ray today, you will receive an invoice from [facility]. Please contact [facility] at [phone] with questions or concerns regarding your invoice.   IF you received labwork today, you will receive an invoice from [facility]. Please contact [facility] at [phone] with questions or concerns regarding your invoice.   Our billing staff will not be able to assist you with questions regarding bills from these companies.  You will be contacted with the lab results as soon as they are available. The fastest way to get your results is to activate your My Chart account. Instructions are located on the last page of this paperwork. If you have not heard from us regarding the results in 2 weeks, please contact this office.

## 2016-02-23 NOTE — Progress Notes (Signed)
30 yo man with some terminal dysuria intermittently for a couple weeks.  One new partner but he used condoms.  He makes the box of mattresses.  No discharge.  No fever. No joint pains.    Objective:  BP 124/66 mmHg  Pulse 77  Temp(Src) 98.9 F (37.2 C) (Oral)  Resp 16  Ht 6\' 1"  (1.854 m)  Wt 194 lb (87.998 kg)  BMI 25.60 kg/m2  SpO2 98% Normal circumcised male Results for orders placed or performed in visit on 02/23/16  POCT urinalysis dipstick  Result Value Ref Range   Color, UA yellow yellow   Clarity, UA clear clear   Glucose, UA negative negative   Bilirubin, UA negative negative   Ketones, POC UA trace (5) (A) negative   Spec Grav, UA 1.020    Blood, UA negative negative   pH, UA 6.0    Protein Ur, POC trace (A) negative   Urobilinogen, UA 4.0    Nitrite, UA Negative Negative   Leukocytes, UA Negative Negative  POCT Microscopic Urinalysis ([facility])  Result Value Ref Range   WBC,UR,HPF,POC Moderate (A) None WBC/hpf   RBC,UR,HPF,POC None None RBC/hpf   Bacteria None None, Too numerous to count   Mucus Absent Absent   Epithelial Cells, UR Per Microscopy Few (A) None, Too numerous to count cells/hpf     Dysuria - Plan: POCT urinalysis dipstick, POCT Microscopic Urinalysis ([facility]), GC/Chlamydia Probe Amp, HIV antibody, RPR, azithromycin (ZITHROMAX) 250 MG tablet, Urine culture  Elvina SidleKurt Trestan Vahle, MD

## 2016-02-24 LAB — GC/CHLAMYDIA PROBE AMP
CT Probe RNA: NOT DETECTED
GC Probe RNA: NOT DETECTED

## 2016-02-24 LAB — HIV ANTIBODY (ROUTINE TESTING W REFLEX): HIV 1&2 Ab, 4th Generation: NONREACTIVE

## 2016-02-24 LAB — RPR

## 2016-02-25 ENCOUNTER — Telehealth: Payer: Self-pay | Admitting: Family Medicine

## 2016-02-25 LAB — URINE CULTURE
Colony Count: NO GROWTH
Organism ID, Bacteria: NO GROWTH

## 2016-02-25 NOTE — Telephone Encounter (Signed)
Informed pt of nml labs. He will come tomorrow to pick up a note for out of work for 02/23/16.
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
## 2019-01-01 ENCOUNTER — Ambulatory Visit (HOSPITAL_COMMUNITY)
Admission: EM | Admit: 2019-01-01 | Discharge: 2019-01-01 | Disposition: A | Discharge status: Home / Self Care | Payer: BLUE CROSS/BLUE SHIELD | Attending: Emergency Medicine | Admitting: Emergency Medicine

## 2019-01-01 ENCOUNTER — Other Ambulatory Visit: Payer: Self-pay

## 2019-01-01 ENCOUNTER — Encounter (HOSPITAL_COMMUNITY): Payer: Self-pay | Admitting: *Deleted

## 2019-01-01 DIAGNOSIS — L02212 Cutaneous abscess of back [any part, except buttock]: Secondary | ICD-10-CM

## 2019-01-01 MED ORDER — DOXYCYCLINE HYCLATE 100 MG PO CAPS: 100.0000 mg | ORAL_CAPSULE | Freq: Two times a day (BID) | ORAL | 0 refills | Status: DC

## 2019-01-01 NOTE — Discharge Instructions (Signed)
Incision and drainage performed.  Dressing applied Avoid submerging in water Keep covered and dry for the next 24-48 hours if possible Change dressing daily  Take antibiotic as prescribed and to completion Return or go to the ED if you have any new or worsening symptoms such as increased pain, increased swelling, redness, fever, chills, nausea, vomiting, etc..Marland Kitchen

## 2019-01-01 NOTE — ED Provider Notes (Signed)
[facility]   [id number] 01/01/19 Arrival Time: 0914   [id number]  SUBJECTIVE:  Jeremy Burke is a 33 y.o. male who presents with a possible abscess of his right upper back x couple of months.  Describes as worsening and increasing in size.  Had a friend try to "pop" it without relief.  Tried applying toothpaste without relief. Worse with work-related activities.  Reports previous symptoms in the past.  Had abscess on his buttocks and had an incision and drainage with relief.  Denies fever, chills, nausea, vomiting, CP, SOB.    ROS: As per HPI.  Past Medical History:  Diagnosis Date  . Distal radius fracture, right 09/18/2013   Past Surgical History:  Procedure Laterality Date  . OPEN REDUCTION INTERNAL FIXATION (ORIF) DISTAL RADIAL FRACTURE Right 09/22/2013   Procedure: RIGHT OPEN REDUCTION INTERNAL FIXATION (ORIF) DISTAL RADIAL FRACTURE;  Surgeon: Marlowe Shores, MD;  Location: [facility];  Service: Orthopedics;  Laterality: Right;  . WISDOM TOOTH EXTRACTION     No Known Allergies No current facility-administered medications on file prior to encounter.    No current outpatient medications on file prior to encounter.   Social History   Socioeconomic History  . Marital status: Single    Spouse name: Not on file  . Number of children: Not on file  . Years of education: Not on file  . Highest education level: Not on file  Occupational History  . Not on file  Social Needs  . Financial resource strain: Not on file  . Food insecurity:    Worry: Not on file    Inability: Not on file  . Transportation needs:    Medical: Not on file    Non-medical: Not on file  Tobacco Use  . Smoking status: Former Smoker    Years: 5.00    Types: Cigarettes  . Smokeless tobacco: Never Used  . Tobacco comment: 5-6 cig./Jeremy  Substance and Sexual Activity  . Alcohol use: Yes    Comment: weekends  . Drug use: No  . Sexual activity: Not on file    Lifestyle  . Physical activity:    Days per week: Not on file    Minutes per session: Not on file  . Stress: Not on file  Relationships  . Social connections:    Talks on phone: Not on file    Gets together: Not on file    Attends religious service: Not on file    Active member of club or organization: Not on file    Attends meetings of clubs or organizations: Not on file    Relationship status: Not on file  . Intimate partner violence:    Fear of current or ex partner: Not on file    Emotionally abused: Not on file    Physically abused: Not on file    Forced sexual activity: Not on file  Other Topics Concern  . Not on file  Social History Narrative  . Not on file   Family History  Problem Relation Age of Onset  . Hypertension Mother   . Hypertension Father     OBJECTIVE:  Vitals:   01/01/19 1018 01/01/19 1021  BP:  (!) 151/93  Pulse:  (!) 54  Temp: 98.5 F (36.9 C)   TempSrc: Oral   SpO2:  98%     General appearance: alert; no distress Skin: 2 x 2 cm induration of his right upper back; tender to touch; no active drainage Psychological: alert  and cooperative; normal mood and affect  Procedure: Verbal consent obtained. Area over induration cleaned with betadine. Lidocaine 2% without epinephrine used to obtain local anesthesia. The most fluctuant portion of the abscess was incised with a #11 blade scalpel. Abscess cavity explored and evacuated. Loculations broken up with a curved hemostat as best as possible given patient discomfort. Dressing applied. Minimal bleeding. No complications.  ASSESSMENT & PLAN:  1. Abscess of back     Meds ordered this encounter  Medications  . doxycycline (VIBRAMYCIN) 100 MG capsule    Sig: Take 1 capsule (100 mg total) by mouth 2 (two) times daily.    Dispense:  20 capsule    Refill:  0    Order Specific Question:   Supervising Provider    Answer:   Eustace Moore [[id number]]   Incision and drainage performed.  Dressing  applied Avoid submerging in water Keep covered and dry for the next 24-48 hours if possible Change dressing daily  Take antibiotic as prescribed and to completion Return or go to the ED if you have any new or worsening symptoms such as increased pain, increased swelling, redness, fever, chills, nausea, vomiting, etc...  Reviewed expectations re: course of current medical issues. Questions answered. Outlined signs and symptoms indicating need for more acute intervention. Patient verbalized understanding. After Visit Summary given.          Rennis Harding, PA-C 01/01/19 1141

## 2019-01-01 NOTE — ED Triage Notes (Signed)
C/o black head on his back, states its been there for a long time now bothering him.

## 2020-03-31 ENCOUNTER — Ambulatory Visit (HOSPITAL_COMMUNITY)
Admission: EM | Admit: 2020-03-31 | Discharge: 2020-03-31 | Disposition: A | Discharge status: Home / Self Care | Payer: BLUE CROSS/BLUE SHIELD | Attending: Emergency Medicine | Admitting: Emergency Medicine

## 2020-03-31 ENCOUNTER — Other Ambulatory Visit: Payer: Self-pay

## 2020-03-31 ENCOUNTER — Encounter (HOSPITAL_COMMUNITY): Payer: Self-pay

## 2020-03-31 DIAGNOSIS — L723 Sebaceous cyst: Secondary | ICD-10-CM | POA: Diagnosis not present

## 2020-03-31 MED ORDER — DOXYCYCLINE HYCLATE 100 MG PO CAPS: 100.0000 mg | ORAL_CAPSULE | Freq: Two times a day (BID) | ORAL | 0 refills | Status: AC

## 2020-03-31 NOTE — ED Provider Notes (Addendum)
[facility]    CSN: [id number] Arrival date & time: 03/31/20  1014      History   Chief Complaint Chief Complaint  Patient presents with  . Abscess    HPI Jeremy Burke is a 34 y.o. male presenting today for evaluation of possible cyst and swelling to his back.  Patient reports over the past month he has had intermittent discomfort associated with this area.  Has had similar areas that have been opened in the past.  Feels it is very bothersome to him at work as he does a lot of lifting.  Denies any drainage.  HPI  Past Medical History:  Diagnosis Date  . Distal radius fracture, right 09/18/2013    There are no problems to display for this patient.   Past Surgical History:  Procedure Laterality Date  . OPEN REDUCTION INTERNAL FIXATION (ORIF) DISTAL RADIAL FRACTURE Right 09/22/2013   Procedure: RIGHT OPEN REDUCTION INTERNAL FIXATION (ORIF) DISTAL RADIAL FRACTURE;  Surgeon: Marlowe Shores, MD;  Location: [facility];  Service: Orthopedics;  Laterality: Right;  . WISDOM TOOTH EXTRACTION         Home Medications    Prior to Admission medications   Medication Sig Start Date End Date Taking? Authorizing Provider  doxycycline (VIBRAMYCIN) 100 MG capsule Take 1 capsule (100 mg total) by mouth 2 (two) times daily for 7 days. 03/31/20 04/07/20  Wieters, Junius Creamer, PA-C    Family History Family History  Problem Relation Age of Onset  . Hypertension Mother   . Hypertension Father     Social History Social History   Tobacco Use  . Smoking status: Former Smoker    Years: 5.00    Types: Cigarettes  . Smokeless tobacco: Never Used  . Tobacco comment: 5-6 cig./day  Substance Use Topics  . Alcohol use: Yes    Comment: weekends  . Drug use: No     Allergies   Patient has no known allergies.   Review of Systems Review of Systems  Constitutional: Negative for fatigue and fever.  Eyes: Negative for redness, itching and visual  disturbance.  Respiratory: Negative for shortness of breath.   Cardiovascular: Negative for chest pain and leg swelling.  Gastrointestinal: Negative for nausea and vomiting.  Musculoskeletal: Negative for arthralgias and myalgias.  Skin: Positive for color change. Negative for rash and wound.  Neurological: Negative for dizziness, syncope, weakness, light-headedness and headaches.     Physical Exam Triage Vital Signs ED Triage Vitals  Enc Vitals Group     BP 03/31/20 1043 (!) 141/91     Pulse Rate 03/31/20 1043 73     Resp 03/31/20 1043 16     Temp 03/31/20 1043 98.3 F (36.8 C)     Temp Source 03/31/20 1043 Oral     SpO2 03/31/20 1043 100 %     Weight --      Height --      Head Circumference --      Peak Flow --      Pain Score 03/31/20 1045 5     Pain Loc --      Pain Edu? --      Excl. in GC? --    No data found.  Updated Vital Signs BP (!) 141/91 (BP Location: Right Arm)   Pulse 73   Temp 98.3 F (36.8 C) (Oral)   Resp 16   SpO2 100%   Visual Acuity Right Eye Distance:   Left Eye Distance:  Bilateral Distance:    Right Eye Near:   Left Eye Near:    Bilateral Near:     Physical Exam Vitals and nursing note reviewed.  Constitutional:      Appearance: He is well-developed.     Comments: No acute distress  HENT:     Head: Normocephalic and atraumatic.     Nose: Nose normal.  Eyes:     Conjunctiva/sclera: Conjunctivae normal.  Cardiovascular:     Rate and Rhythm: Normal rate.  Pulmonary:     Effort: Pulmonary effort is normal. No respiratory distress.  Abdominal:     General: There is no distension.  Musculoskeletal:        General: Normal range of motion.     Cervical back: Neck supple.  Skin:    General: Skin is warm and dry.     Comments: Left superior thoracic area with 1.5 x 1.5 cm area slightly raised, hyperpigmentation with central black punctate lesion, signs of other sebaceous cyst noted to central and right side of back  Neurological:      Mental Status: He is alert and oriented to person, place, and time.      UC Treatments / Results  Labs (all labs ordered are listed, but only abnormal results are displayed) Labs Reviewed - No data to display  EKG   Radiology No results found.  Procedures Incision and Drainage  Date/Time: 03/31/2020 11:39 AM Performed by: Wieters, [place] C, PA-C Authorized by: Wieters, Junius Creamer, PA-C   Consent:    Consent obtained:  Verbal   Consent given by:  Patient   Risks discussed:  Bleeding, incomplete drainage and pain   Alternatives discussed:  Alternative treatment Location:    Type:  Cyst   Size:  1.5   Location:  Trunk   Trunk location:  Back Pre-procedure details:    Skin preparation:  Betadine Anesthesia (see MAR for exact dosages):    Anesthesia method:  Local infiltration   Local anesthetic:  Lidocaine 2% w/o epi Procedure type:    Complexity:  Simple Procedure details:    Incision types:  Single straight   Incision depth:  Subcutaneous   Scalpel blade:  11   Wound management:  Probed and deloculated   Drainage:  Bloody and purulent   Drainage amount:  Moderate   Wound treatment:  Wound left open   Packing materials:  None Post-procedure details:    Patient tolerance of procedure:  Tolerated well, no immediate complications   (including critical care time)  Medications Ordered in UC Medications - No data to display  Initial Impression / Assessment and Plan / UC Course  I have reviewed the triage vital signs and the nursing notes.  Pertinent labs & imaging results that were available during my care of the patient were reviewed by me and considered in my medical decision making (see chart for details).     I&D to sebaceous cyst.  Discussed with patient likelihood of this recurring given I&D, likely needs for excision and recommended to follow-up with dermatology for this.  Empirically placing on doxycycline.  Warm compresses.  Monitor for healing.   Tylenol and ibuprofen for pain and swelling.  Discussed strict return precautions. Patient verbalized understanding and is agreeable with plan.  Final Clinical Impressions(s) / UC Diagnoses   Final diagnoses:  Inflamed sebaceous cyst     Discharge Instructions     Cyst drained, this may reoccur, consider following up with dermatology to have cyst fully removed  Begin doxycycline twice daily x 1week Warm compresses Follow up if not improving or worsening    ED Prescriptions    Medication Sig Dispense Auth. Provider   doxycycline (VIBRAMYCIN) 100 MG capsule Take 1 capsule (100 mg total) by mouth 2 (two) times daily for 7 days. 14 capsule Wieters, [place] C, PA-C     PDMP not reviewed this encounter.   Janith Lima, PA-C 03/31/20 1141      Janith Lima, PA-C 03/31/20 1142

## 2020-03-31 NOTE — Discharge Instructions (Signed)
Cyst drained, this may reoccur, consider following up with dermatology to have cyst fully removed  Begin doxycycline twice daily x 1week Warm compresses Follow up if not improving or worsening

## 2020-03-31 NOTE — ED Triage Notes (Signed)
Pt c/o abscess to left scapula region for approx 1 month. Denies drainage from area, negative for fever, chills, n/v/d.  Approx 2cm diameter raised area to left scapula noted, hard when palpated.  No OTC pain relievers used.

## 2024-06-05 LAB — LIPID PANEL
Cholesterol: 144
HDL: 50
LDL: 70
Triglycerides: 122 (ref 40–160)

## 2024-06-05 NOTE — Progress Notes (Signed)
 Patient attended [facility] [place] Event on 06/05/2024. Their BP was 130/83. Pt is not a smoker. Pt does not have any SDOH needs at this time.   Follow up with patient will occur per [facility] team protocol.

## 2024-08-03 NOTE — Progress Notes (Addendum)
 The patient attended a screening event on 06/05/2024 where his BP screening results was 130/83. At the event the patient noted he has [employer] and does not smoke. Patient indicated having food SDOH insecurity at the event. Pt did not list pcp. Per chart review there are no past or future visits within the past 12 months. Post event initial f/u [facility] called pt on 08/04/2024. Pt stated he does not have a pcp currently would like resources sent to him due to him currently being at work and unable to talk. Letter sent with [facility] and [facility] PCP and food resource flyer in case needed by pt. Additional pt f/u to be scheduled per health equity protocol.

## 2024-09-06 NOTE — Progress Notes (Signed)
 Pt attended 06/05/24 screening event where his blood pressure was 130/83. Pt documented not having a PCP, he did not that he has insurances, and also noted SDOH food insecurity.  Chart review indicated that pt is insured through [employer] and does not have a listed PCP. Pt has not [facility] visible encounters within the past three years other than the screening event. [facility] called pt to follow up on PCP status and food insecurity. Voicemail left for pt asking to return [facility] call at his earliest convenience.  Letter sent to pt with [facility] flyer, community clinics and food pantry information and out of the garden project schedule.  Future follow up to be scheduled per [facility] protocol.
